# Patient Record
Sex: Female | Born: 1985 | Race: White | Hispanic: Yes | Marital: Married | Smoking: Never smoker
Health system: Southern US, Community
[De-identification: ages and names within clinical notes are randomized; demographics above are authoritative.]

## PROBLEM LIST (undated history)

## (undated) ENCOUNTER — Emergency Department (HOSPITAL_COMMUNITY): Payer: Self-pay

## (undated) DIAGNOSIS — E785 Hyperlipidemia, unspecified: Secondary | ICD-10-CM

## (undated) DIAGNOSIS — E119 Type 2 diabetes mellitus without complications: Secondary | ICD-10-CM

## (undated) DIAGNOSIS — I10 Essential (primary) hypertension: Secondary | ICD-10-CM

## (undated) HISTORY — DX: Essential (primary) hypertension: I10

## (undated) HISTORY — DX: Hyperlipidemia, unspecified: E78.5

## (undated) HISTORY — DX: Type 2 diabetes mellitus without complications: E11.9

## (undated) HISTORY — PX: NO PAST SURGERIES: SHX2092

---
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
## 2006-10-03 ENCOUNTER — Inpatient Hospital Stay (HOSPITAL_COMMUNITY): Admission: AD | Admit: 2006-10-03 | Discharge: 2006-10-05 | Payer: Self-pay | Admitting: Obstetrics

## 2007-01-01 ENCOUNTER — Ambulatory Visit: Payer: Self-pay | Admitting: Family Medicine

## 2007-02-03 ENCOUNTER — Encounter (INDEPENDENT_AMBULATORY_CARE_PROVIDER_SITE_OTHER): Payer: Self-pay | Admitting: *Deleted

## 2007-02-03 ENCOUNTER — Ambulatory Visit: Payer: Self-pay | Admitting: Family Medicine

## 2007-03-12 ENCOUNTER — Ambulatory Visit: Payer: Self-pay | Admitting: Obstetrics and Gynecology

## 2007-04-09 ENCOUNTER — Ambulatory Visit: Payer: Self-pay | Admitting: Obstetrics and Gynecology

## 2007-04-20 ENCOUNTER — Ambulatory Visit: Payer: Self-pay | Admitting: Family Medicine

## 2007-05-13 ENCOUNTER — Encounter (INDEPENDENT_AMBULATORY_CARE_PROVIDER_SITE_OTHER): Payer: Self-pay | Admitting: Family Medicine

## 2007-05-13 ENCOUNTER — Ambulatory Visit: Payer: Self-pay | Admitting: Family Medicine

## 2007-12-25 ENCOUNTER — Ambulatory Visit: Payer: Self-pay | Admitting: *Deleted

## 2007-12-25 ENCOUNTER — Ambulatory Visit: Payer: Self-pay | Admitting: Family Medicine

## 2008-04-14 ENCOUNTER — Ambulatory Visit: Payer: Self-pay | Admitting: Family Medicine

## 2008-05-30 ENCOUNTER — Ambulatory Visit: Payer: Self-pay | Admitting: Internal Medicine

## 2008-06-14 ENCOUNTER — Ambulatory Visit: Payer: Self-pay | Admitting: Internal Medicine

## 2008-06-16 ENCOUNTER — Ambulatory Visit (HOSPITAL_COMMUNITY): Admission: RE | Admit: 2008-06-16 | Discharge: 2008-06-16 | Payer: Self-pay | Admitting: Family Medicine

## 2009-12-12 ENCOUNTER — Emergency Department (HOSPITAL_COMMUNITY): Admission: EM | Admit: 2009-12-12 | Discharge: 2009-12-12 | Payer: Self-pay | Admitting: Family Medicine

## 2010-08-02 ENCOUNTER — Encounter (INDEPENDENT_AMBULATORY_CARE_PROVIDER_SITE_OTHER): Payer: Self-pay | Admitting: Family Medicine

## 2010-08-02 LAB — CONVERTED CEMR LAB
AST: 18 units/L (ref 0–37)
Albumin: 4.7 g/dL (ref 3.5–5.2)
BUN: 11 mg/dL (ref 6–23)
CO2: 24 meq/L (ref 19–32)
Calcium: 9.4 mg/dL (ref 8.4–10.5)
Chlamydia, Swab/Urine, PCR: NEGATIVE
Chloride: 104 meq/L (ref 96–112)
Creatinine, Ser: 0.49 mg/dL (ref 0.40–1.20)
Glucose, Bld: 84 mg/dL (ref 70–99)
Hemoglobin: 14.1 g/dL (ref 12.0–15.0)
Lymphocytes Relative: 40 % (ref 12–46)
Lymphs Abs: 2.2 10*3/uL (ref 0.7–4.0)
Monocytes Absolute: 0.3 10*3/uL (ref 0.1–1.0)
Monocytes Relative: 5 % (ref 3–12)
Neutro Abs: 2.7 10*3/uL (ref 1.7–7.7)
Neutrophils Relative %: 50 % (ref 43–77)
Potassium: 4 meq/L (ref 3.5–5.3)
RBC: 4.5 M/uL (ref 3.87–5.11)
TSH: 1.99 microintl units/mL (ref 0.350–4.500)
Vit D, 25-Hydroxy: 18 ng/mL — ABNORMAL LOW (ref 30–89)
WBC: 5.4 10*3/uL (ref 4.0–10.5)

## 2011-01-07 LAB — POCT URINALYSIS DIP (DEVICE)
Nitrite: NEGATIVE
Protein, ur: NEGATIVE mg/dL
Specific Gravity, Urine: 1.015 (ref 1.005–1.030)
Urobilinogen, UA: 1 mg/dL (ref 0.0–1.0)

## 2011-01-07 LAB — POCT PREGNANCY, URINE: Preg Test, Ur: NEGATIVE

## 2016-02-28 ENCOUNTER — Ambulatory Visit: Payer: Self-pay | Attending: Internal Medicine

## 2016-09-18 LAB — AMB REFERRAL TO OB-GYN: Pap: NEGATIVE

## 2017-02-03 ENCOUNTER — Encounter (HOSPITAL_COMMUNITY): Payer: Self-pay | Admitting: Emergency Medicine

## 2017-02-03 ENCOUNTER — Emergency Department (HOSPITAL_COMMUNITY)
Admission: EM | Admit: 2017-02-03 | Discharge: 2017-02-04 | Disposition: A | Discharge status: Home / Self Care | Payer: Self-pay | Attending: Emergency Medicine | Admitting: Emergency Medicine

## 2017-02-03 DIAGNOSIS — R739 Hyperglycemia, unspecified: Secondary | ICD-10-CM | POA: Insufficient documentation

## 2017-02-03 LAB — BASIC METABOLIC PANEL
ANION GAP: 13 (ref 5–15)
BUN: 8 mg/dL (ref 6–20)
CALCIUM: 9.7 mg/dL (ref 8.9–10.3)
CO2: 20 mmol/L — AB (ref 22–32)
Chloride: 97 mmol/L — ABNORMAL LOW (ref 101–111)
Creatinine, Ser: 0.52 mg/dL (ref 0.44–1.00)
Glucose, Bld: 492 mg/dL — ABNORMAL HIGH (ref 65–99)
Potassium: 3.4 mmol/L — ABNORMAL LOW (ref 3.5–5.1)
Sodium: 130 mmol/L — ABNORMAL LOW (ref 135–145)

## 2017-02-03 LAB — URINALYSIS, ROUTINE W REFLEX MICROSCOPIC
BILIRUBIN URINE: NEGATIVE
Bacteria, UA: NONE SEEN
Glucose, UA: >=500 mg/dL — AB
KETONES UR: NEGATIVE mg/dL
LEUKOCYTES UA: NEGATIVE
NITRITE: NEGATIVE
PROTEIN: NEGATIVE mg/dL
Specific Gravity, Urine: 1.009 (ref 1.005–1.030)
pH: 6 (ref 5.0–8.0)

## 2017-02-03 LAB — CBC
HCT: 41.2 % (ref 36.0–46.0)
HEMOGLOBIN: 14.7 g/dL (ref 12.0–15.0)
MCH: 32 pg (ref 26.0–34.0)
MCHC: 35.7 g/dL (ref 30.0–36.0)
MCV: 89.6 fL (ref 78.0–100.0)
Platelets: 230 10*3/uL (ref 150–400)
RBC: 4.6 MIL/uL (ref 3.87–5.11)
RDW: 11.8 % (ref 11.5–15.5)
WBC: 6.4 10*3/uL (ref 4.0–10.5)

## 2017-02-03 LAB — CBG MONITORING, ED: GLUCOSE-CAPILLARY: 495 mg/dL — AB (ref 65–99)

## 2017-02-03 NOTE — ED Triage Notes (Signed)
Patient arrives with complaint of hyperglycemia. Was at family's home this evening and was feeling worse than usual. States 3 months of dizziness, lightheadedness, blurred vision, fatigue, and thirst. Family member who is diabetic checked patient's blood sugar and found the level to be near 600. Family member then gave patient  of Metformin from his supply. Patient states that today her symptoms are much worse than usual.

## 2017-02-04 LAB — PREGNANCY, URINE: Preg Test, Ur: NEGATIVE

## 2017-02-04 LAB — CBG MONITORING, ED: GLUCOSE-CAPILLARY: 300 mg/dL — AB (ref 65–99)

## 2017-02-04 MED ORDER — METFORMIN HCL 500 MG PO TABS: 500.0000 mg | ORAL_TABLET | Freq: Two times a day (BID) | ORAL | 0 refills | Status: DC

## 2017-02-04 MED ORDER — SODIUM CHLORIDE 0.9 % IV BOLUS (SEPSIS)
1000.0000 mL | Freq: Once | INTRAVENOUS | Status: AC
  Administered 2017-02-04: 1000 mL via INTRAVENOUS

## 2017-02-04 NOTE — ED Provider Notes (Signed)
[facility]-EMERGENCY DEPT Provider Note   CSN: [id number] Arrival date & time: 02/03/17  2056  By signing my name below, I, Lindsay Hunt, attest that this documentation has been prepared under the direction and in the presence of [name], MD. Electronically Signed: Rosario Hunt, ED Scribe. 02/04/17. 12:53 AM.  History   Chief Complaint Chief Complaint  Patient presents with  . Hyperglycemia   The history is provided by the patient. A language interpreter was used [place], [id number]).  Hyperglycemia  Blood sugar level PTA:  Near 600 Onset quality:  Gradual Timing:  Constant Progression:  Worsening Chronicity:  New Diabetes status:  Non-diabetic Ineffective treatments: Metformin. Associated symptoms: abdominal pain, fatigue and increased thirst   Associated symptoms: no chest pain, no fever, no nausea and no vomiting   Abdominal pain:    Location:  Suprapubic Risk factors: family hx of diabetes    HPI Comments: Lindsay Hunt is a 31 y.o. female with no pertinent PMHx, who presents to the Emergency Department complaining of worsening hyperglycemia beginning today. Per pt, she arrives in the ED today to have her CBG checked. She notes that over the past two months that she has been dizzy, increasingly thirsty, and more fatigued from her baseline. She also mentions that she has experienced intermittent suprapubic abdominal pain, had an appearance of worsening facial acne, and has been steadily loosing weight as well. Pt also reports that today her symptoms seemed worse from her baseline. She does not have a h/o diagnosed DM; however, pt's brother who is a diabetic did check her CBG yesterday and it was noted to be near 600 at that time. He did give her  of his Metformin at that time without noted relief of her symptoms. Pt is not currently followed by a PCP. She denies fever, nausea, vomiting, chest pain, dysuria, or any other associated symptoms.   PMH - none OB  History    No data available     Home Medications    Prior to Admission medications   Not on File   Family History History reviewed. No pertinent family history.  Social History Social History  Substance Use Topics  . Smoking status: Never Smoker  . Smokeless tobacco: Never Used  . Alcohol use No   llergies   Patient has no known allergies.  Review of Systems Review of Systems  Constitutional: Positive for fatigue and unexpected weight change. Negative for fever.  Cardiovascular: Negative for chest pain.  Gastrointestinal: Positive for abdominal pain. Negative for nausea and vomiting.  Endocrine: Positive for polydipsia.  All other systems reviewed and are negative.  Physical Exam Updated Vital Signs BP 116/83 (BP Location: Right Arm)   Pulse 79   Temp 97.7 F (36.5 C) (Oral)   Resp 16   Ht 5' (1.524 m)   Wt 146 lb 14.4 oz (66.6 kg)   LMP 02/03/2017 (Exact Date) Comment: States 2 months of bleeding   SpO2 100%   BMI 28.69 kg/m   Physical Exam CONSTITUTIONAL: Well developed/well nourished HEAD: Normocephalic/atraumatic EYES: EOMI/PERRL ENMT: Mucous membranes moist NECK: supple no meningeal signs SPINE/BACK:entire spine nontender CV: S1/S2 noted, no murmurs/rubs/gallops noted LUNGS: Lungs are clear to auscultation bilaterally, no apparent distress ABDOMEN: soft, nontender, no rebound or guarding, bowel sounds noted throughout abdomen GU:no cva tenderness NEURO: Pt is awake/alert/appropriate, moves all extremitiesx4.  No facial droop.   EXTREMITIES: pulses normal/equal, full ROM SKIN: warm, color normal; facial acne noted PSYCH: no abnormalities of mood noted, alert and  oriented to situation  ED Treatments / Results  DIAGNOSTIC STUDIES: Oxygen Saturation is 100% on RA, normal by my interpretation.   COORDINATION OF CARE: 12:29 AM-Discussed next steps with pt. Pt verbalized understanding and is agreeable with the plan.   Labs (all labs ordered are  listed, but only abnormal results are displayed) Labs Reviewed  BASIC METABOLIC PANEL - Abnormal; Notable for the following:       Result Value   Sodium 130 (*)    Potassium 3.4 (*)    Chloride 97 (*)    CO2 20 (*)    Glucose, Bld 492 (*)    All other components within normal limits  URINALYSIS, ROUTINE W REFLEX MICROSCOPIC - Abnormal; Notable for the following:    Color, Urine COLORLESS (*)    Glucose, UA >=500 (*)    Hgb urine dipstick MODERATE (*)    Squamous Epithelial / LPF 0-5 (*)    All other components within normal limits  CBG MONITORING, ED - Abnormal; Notable for the following:    Glucose-Capillary 495 (*)    All other components within normal limits  CBG MONITORING, ED - Abnormal; Notable for the following:    Glucose-Capillary 300 (*)    All other components within normal limits  CBC  PREGNANCY, URINE   EKG  EKG Interpretation None      Radiology No results found.  Procedures Procedures   Medications Ordered in ED Medications  sodium chloride 0.9 % bolus 1,000 mL (0 mLs Intravenous Stopped 02/04/17 0150)  sodium chloride 0.9 % bolus 1,000 mL (0 mLs Intravenous Stopped 02/04/17 0150)    Initial Impression / Assessment and Plan / ED Course  I have reviewed the triage vital signs and the nursing notes.  Pertinent labs  results that were available during my care of the patient were reviewed by me and considered in my medical decision making (see chart for details).    2:50 AM  Pt stable No anion gap Glucose improved with IV fluids Will start metformin At time of discharge with interpreter - pt mentions recent irregular vag bleeding She is not pregnant Hemoglobin normal Advised f/u with GYN Referred to PCP as well for glucose control We discussed return precautions   Final Clinical Impressions(s) / ED Diagnoses   Final diagnoses:  Hyperglycemia   New Prescriptions Discharge Medication List as of 02/04/2017  2:40 AM    START taking these  medications   Details  metFORMIN (GLUCOPHAGE) 500 MG tablet Take 1 tablet (500 mg total) by mouth 2 (two) times daily with a meal., Starting Tue 02/04/2017, Print       I personally performed the services described in this documentation, which was scribed in my presence. The recorded information has been reviewed and is accurate.       [name], MD 02/04/17 [phone]

## 2017-02-04 NOTE — ED Notes (Signed)
Pt verbalized understanding of d/c instructions and has no further questions. Pt is stable, A&Ox4, VSS.  

## 2017-02-14 ENCOUNTER — Ambulatory Visit: Payer: Self-pay | Attending: Physician Assistant

## 2017-02-20 ENCOUNTER — Ambulatory Visit (INDEPENDENT_AMBULATORY_CARE_PROVIDER_SITE_OTHER): Payer: Self-pay | Admitting: Physician Assistant

## 2017-02-20 ENCOUNTER — Encounter (INDEPENDENT_AMBULATORY_CARE_PROVIDER_SITE_OTHER): Payer: Self-pay | Admitting: Physician Assistant

## 2017-02-20 VITALS — BP 109/72 | HR 74 | Temp 98.1°F | Ht 60.0 in | Wt 143.8 lb

## 2017-02-20 DIAGNOSIS — R739 Hyperglycemia, unspecified: Secondary | ICD-10-CM

## 2017-02-20 DIAGNOSIS — E1169 Type 2 diabetes mellitus with other specified complication: Secondary | ICD-10-CM

## 2017-02-20 LAB — POCT URINALYSIS DIPSTICK
BILIRUBIN UA: NEGATIVE
GLUCOSE UA: 100
Ketones, UA: NEGATIVE
LEUKOCYTES UA: NEGATIVE
NITRITE UA: NEGATIVE
Protein, UA: NEGATIVE
Spec Grav, UA: 1.015 (ref 1.010–1.025)
UROBILINOGEN UA: 0.2 U/dL
pH, UA: 7 (ref 5.0–8.0)

## 2017-02-20 LAB — POCT GLYCOSYLATED HEMOGLOBIN (HGB A1C): HEMOGLOBIN A1C: 11.3

## 2017-02-20 LAB — POCT CBG (FASTING - GLUCOSE)-MANUAL ENTRY: Glucose Fasting, POC: 231 mg/dL — AB (ref 70–99)

## 2017-02-20 MED ORDER — PEN NEEDLES 30G X 8 MM MISC: 1.0000 "application " | Freq: Every day | 0 refills | Status: DC

## 2017-02-20 MED ORDER — INSULIN GLARGINE 100 UNIT/ML SOLOSTAR PEN: 10.0000 [IU] | PEN_INJECTOR | Freq: Every day | SUBCUTANEOUS | 99 refills | Status: DC

## 2017-02-20 MED ORDER — BLOOD GLUCOSE MONITOR KIT: PACK | 0 refills | Status: DC

## 2017-02-20 NOTE — Progress Notes (Signed)
Subjective:  Patient ID: Lindsay Hunt, female    DOB: 08/15/1986  Age: 31 y.o. MRN: [id number]  CC: diabetes  HPI Lindsay Hunt is a 31 y.o. female with newly diagnosed DM2 presents to establish care for DM2. She learned that she had diabetes 2 weeks ago. Is only taking metformin. Currently with polyuria, tingling in the fingers, visual blurring, some confusion/memory loss, and fatigue. Denies polydipsia and abdominal pain. A1c 11.3% in clinic today. CBG 231 in clinic today. UA with glucose 100, neg ketone  Outpatient Medications Prior to Visit  Medication Sig Dispense Refill  . metFORMIN (GLUCOPHAGE) 500 MG tablet Take 1 tablet (500 mg total) by mouth 2 (two) times daily with a meal. 28 tablet 0   No facility-administered medications prior to visit.      ROS Review of Systems  Constitutional: Positive for malaise/fatigue. Negative for chills and fever.  Eyes: Positive for blurred vision.  Respiratory: Negative for shortness of breath.   Cardiovascular: Negative for chest pain and palpitations.  Gastrointestinal: Negative for abdominal pain and nausea.  Genitourinary: Negative for dysuria and hematuria.  Musculoskeletal: Negative for joint pain and myalgias.  Skin: Negative for rash.  Neurological: Positive for tingling. Negative for headaches.  Endo/Heme/Allergies: Negative for polydipsia.  Psychiatric/Behavioral: Positive for memory loss. Negative for depression. The patient is not nervous/anxious.     Objective:  BP 109/72 (BP Location: Left Arm, Patient Position: Sitting, Cuff Size: Normal)   Pulse 74   Temp 98.1 F (36.7 C) (Oral)   Ht 5' (1.524 m)   Wt 143 lb 12.8 oz (65.2 kg)   LMP 02/03/2017 (Exact Date) Comment: States 2 months of bleeding   SpO2 98%   BMI 28.08 kg/m   BP/Weight 02/20/2017 02/04/2017 4/97/0263  Systolic BP 785 885 -  Diastolic BP 72 67 -  Wt. (Lbs) 143.8 - 146.9  BMI 28.08 - 28.69      Physical Exam  Constitutional: She is oriented to  person, place, and time.  Well developed, overweight, NAD, polite  HENT:  Head: Normocephalic and atraumatic.  No oral thrush  Eyes: No scleral icterus.  Neck: Normal range of motion. Neck supple. No thyromegaly present.  Cardiovascular: Normal rate, regular rhythm and normal heart sounds.   Pulmonary/Chest: Effort normal and breath sounds normal.  Abdominal: Soft. Bowel sounds are normal. There is no tenderness.  Musculoskeletal: She exhibits no edema.  Neurological: She is alert and oriented to person, place, and time.  Skin: Skin is warm and dry. No rash noted. No erythema. No pallor.  Psychiatric: She has a normal mood and affect. Her behavior is normal. Thought content normal.  Vitals reviewed.    Assessment & Plan:   1. Type 2 diabetes mellitus with other specified complication, without long-term current use of insulin (HCC) - Urinalysis Dipstick with glucose 100, neg ketone - Begin  blood glucose meter kit and supplies KIT; Dispense based on patient and insurance preference. Use up to four times daily as directed. (FOR ICD-9 250.00, 250.01).  Dispense: 1 each; Refill: 0 - Begin Insulin Pen Needle (PEN NEEDLES) 30G X 8 MM MISC; 1 application by Does not apply route at bedtime.  Dispense: 30 each; Refill: 0 - Begin Insulin Glargine (LANTUS SOLOSTAR) 100 UNIT/ML Solostar Pen; Inject 10 Units into the skin daily at 10 pm.  Dispense: 1 pen; Refill: PRN - Comprehensive metabolic panel - Microalbumin/Creatinine Ratio, Urine  2. Hyperglycemia - HgB A1c 11.3% in clinic today - Glucose (CBG), Fasting 231  in clinic today.   Meds ordered this encounter  Medications  . DISCONTD: Insulin Glargine (LANTUS SOLOSTAR) 100 UNIT/ML Solostar Pen    Sig: Inject 10 Units into the skin daily at 10 pm.    Dispense:  1 pen    Refill:  PRN    Order Specific Question:   Supervising Provider    Answer:   Tresa Garter [[id number]]  . DISCONTD: Insulin Pen Needle (PEN NEEDLES) 30G X 8 MM MISC     Sig: 1 application by Does not apply route at bedtime.    Dispense:  30 each    Refill:  0    Order Specific Question:   Supervising Provider    Answer:   Tresa Garter [id number]  . blood glucose meter kit and supplies KIT    Sig: Dispense based on patient and insurance preference. Use up to four times daily as directed. (FOR ICD-9 250.00, 250.01).    Dispense:  1 each    Refill:  0    Order Specific Question:   Supervising Provider    Answer:   Tresa Garter [id number]    Order Specific Question:   Number of strips    Answer:   100    Order Specific Question:   Number of lancets    Answer:   100  . Insulin Pen Needle (PEN NEEDLES) 30G X 8 MM MISC    Sig: 1 application by Does not apply route at bedtime.    Dispense:  30 each    Refill:  0    Order Specific Question:   Supervising Provider    Answer:   Tresa Garter [id number]  . Insulin Glargine (LANTUS SOLOSTAR) 100 UNIT/ML Solostar Pen    Sig: Inject 10 Units into the skin daily at 10 pm.    Dispense:  1 pen    Refill:  PRN    Order Specific Question:   Supervising Provider    Answer:   Tresa Garter [[id number]]    Follow-up: Return in about 2 weeks (around 03/06/2017) for diabetes.   Clent Demark PA

## 2017-02-20 NOTE — Patient Instructions (Signed)
La diabetes mellitus y los alimentos (Diabetes Mellitus and Food) Es importante que controle su nivel de azcar en la sangre (glucosa). El nivel de glucosa en sangre depende en gran medida de lo que usted come. Comer alimentos saludables en las cantidades adecuadas a lo largo del da, aproximadamente a la misma hora todos los das, lo ayudar a controlar su nivel de glucosa en sangre. Tambin puede ayudarlo a retrasar o evitar el empeoramiento de la diabetes mellitus. Comer de manera saludable incluso puede ayudarlo a mejorar el nivel de presin arterial y a alcanzar o mantener un peso saludable. Entre las recomendaciones generales para alimentarse y cocinar los alimentos de forma saludable, se incluyen las siguientes:  Respetar las comidas principales y comer colaciones con regularidad. Evitar pasar largos perodos sin comer con el fin de perder peso.  Seguir una dieta que consista principalmente en alimentos de origen vegetal, como frutas, vegetales, frutos secos, legumbres y cereales integrales.  Utilizar mtodos de coccin a baja temperatura, como hornear, en lugar de mtodos de coccin a alta temperatura, como frer en abundante aceite. Trabaje con el nutricionista para aprender a usar la informacin nutricional de las etiquetas de los alimentos. CMO PUEDEN AFECTARME LOS ALIMENTOS? Carbohidratos Los carbohidratos afectan el nivel de glucosa en sangre ms que cualquier otro tipo de alimento. El nutricionista lo ayudar a determinar cuntos carbohidratos puede consumir en cada comida y ensearle a contarlos. El recuento de carbohidratos es importante para mantener la glucosa en sangre en un nivel saludable, en especial si utiliza insulina o toma determinados medicamentos para la diabetes mellitus. Alcohol El alcohol puede provocar disminuciones sbitas de la glucosa en sangre (hipoglucemia), en especial si utiliza insulina o toma determinados medicamentos para la diabetes mellitus. La  hipoglucemia es una afeccin que puede poner en peligro la vida. Los sntomas de la hipoglucemia (somnolencia, mareos y desorientacin) son similares a los sntomas de haber consumido mucho alcohol. Si el mdico lo autoriza a beber alcohol, hgalo con moderacin y siga estas pautas:  Las mujeres no deben beber ms de un trago por da, y los hombres no deben beber ms de dos tragos por da. Un trago es igual a:  12 onzas (355 ml) de cerveza  5 onzas de vino (150 ml) de vino  1,5onzas (45ml) de bebidas espirituosas  No beba con el estmago vaco.  Mantngase hidratado. Beba agua, gaseosas dietticas o t helado sin azcar.  Las gaseosas comunes, los jugos y otros refrescos podran contener muchos carbohidratos y se deben contar. QU ALIMENTOS NO SE RECOMIENDAN? Cuando haga las elecciones de alimentos, es importante que recuerde que todos los alimentos son distintos. Algunos tienen menos nutrientes que otros por porcin, aunque podran tener la misma cantidad de caloras o carbohidratos. Es difcil darle al cuerpo lo que necesita cuando consume alimentos con menos nutrientes. Estos son algunos ejemplos de alimentos que debera evitar ya que contienen muchas caloras y carbohidratos, pero pocos nutrientes:  Grasas trans (la mayora de los alimentos procesados incluyen grasas trans en la etiqueta de Informacin nutricional).  Gaseosas comunes.  Jugos.  Caramelos.  Dulces, como tortas, pasteles, rosquillas y galletas.  Comidas fritas. QU ALIMENTOS PUEDO COMER? Consuma alimentos ricos en nutrientes, que nutrirn el cuerpo y lo mantendrn saludable. Los alimentos que debe comer tambin dependern de varios factores, como:  Las caloras que necesita.  Los medicamentos que toma.  Su peso.  El nivel de glucosa en sangre.  El nivel de presin arterial.  El nivel de colesterol. Debe consumir   una amplia variedad de alimentos, por ejemplo:  Protenas.  Cortes de carne  magros.  Protenas con bajo contenido de grasas saturadas, como pescado, clara de huevo y frijoles. Evite las carnes procesadas.  Frutas y vegetales.  Frutas y vegetales que pueden ayudar a controlar los niveles sanguneos de glucosa, como manzanas, mangos y batatas.  Productos lcteos.  Elija productos lcteos sin grasa o con bajo contenido de grasa, como leche, yogur y queso.  Cereales, panes, pastas y arroz.  Elija cereales integrales, como panes multicereales, avena en grano y arroz integral. Estos alimentos pueden ayudar a controlar la presin arterial.  Grasas.  Alimentos que contengan grasas saludables, como frutos secos, aguacate, aceite de oliva, aceite de canola y pescado. TODOS LOS QUE PADECEN DIABETES MELLITUS TIENEN EL MISMO PLAN DE COMIDAS? Dado que todas las personas que padecen diabetes mellitus son distintas, no hay un solo plan de comidas que funcione para todos. Es muy importante que se rena con un nutricionista que lo ayudar a crear un plan de comidas adecuado para usted. Esta informacin no tiene como fin reemplazar el consejo del mdico. Asegrese de hacerle al mdico cualquier pregunta que tenga. Document Released: 01/07/2008 Document Revised: 10/21/2014 Document Reviewed: 08/27/2013 Elsevier Interactive Patient Education  2017 Elsevier Inc.  

## 2017-02-21 ENCOUNTER — Other Ambulatory Visit (INDEPENDENT_AMBULATORY_CARE_PROVIDER_SITE_OTHER): Payer: Self-pay | Admitting: Physician Assistant

## 2017-02-21 ENCOUNTER — Telehealth (INDEPENDENT_AMBULATORY_CARE_PROVIDER_SITE_OTHER): Payer: Self-pay | Admitting: Physician Assistant

## 2017-02-21 DIAGNOSIS — E119 Type 2 diabetes mellitus without complications: Secondary | ICD-10-CM

## 2017-02-21 LAB — COMPREHENSIVE METABOLIC PANEL
ALBUMIN: 4.4 g/dL (ref 3.5–5.5)
ALK PHOS: 104 IU/L (ref 39–117)
ALT: 38 IU/L — ABNORMAL HIGH (ref 0–32)
AST: 28 IU/L (ref 0–40)
Albumin/Globulin Ratio: 1.5 (ref 1.2–2.2)
BUN/Creatinine Ratio: 16 (ref 9–23)
BUN: 8 mg/dL (ref 6–20)
Bilirubin Total: 0.3 mg/dL (ref 0.0–1.2)
CALCIUM: 10 mg/dL (ref 8.7–10.2)
CO2: 23 mmol/L (ref 18–29)
CREATININE: 0.51 mg/dL — AB (ref 0.57–1.00)
Chloride: 102 mmol/L (ref 96–106)
GFR calc Af Amer: 146 mL/min/{1.73_m2} (ref >59)
GFR, EST NON AFRICAN AMERICAN: 127 mL/min/{1.73_m2} (ref >59)
GLOBULIN, TOTAL: 2.9 g/dL (ref 1.5–4.5)
GLUCOSE: 210 mg/dL — AB (ref 65–99)
Potassium: 4.3 mmol/L (ref 3.5–5.2)
SODIUM: 138 mmol/L (ref 134–144)
Total Protein: 7.3 g/dL (ref 6.0–8.5)

## 2017-02-21 LAB — MICROALBUMIN / CREATININE URINE RATIO
CREATININE, UR: 39.3 mg/dL
Microalb/Creat Ratio: <7.6 mg/g creat (ref 0.0–30.0)

## 2017-02-21 MED ORDER — METFORMIN HCL 500 MG PO TABS: 500.0000 mg | ORAL_TABLET | Freq: Two times a day (BID) | ORAL | 0 refills | Status: DC

## 2017-02-21 NOTE — Telephone Encounter (Signed)
Patient called left voicemail stated pharmacy [facility]  Please send RX and call her back when sent.

## 2017-02-21 NOTE — Progress Notes (Signed)
Refill request

## 2017-02-24 ENCOUNTER — Ambulatory Visit (INDEPENDENT_AMBULATORY_CARE_PROVIDER_SITE_OTHER): Payer: Self-pay | Admitting: Physician Assistant

## 2017-02-24 ENCOUNTER — Encounter (INDEPENDENT_AMBULATORY_CARE_PROVIDER_SITE_OTHER): Payer: Self-pay | Admitting: Physician Assistant

## 2017-02-24 VITALS — BP 109/72 | HR 75 | Temp 98.1°F | Wt 144.2 lb

## 2017-02-24 DIAGNOSIS — E119 Type 2 diabetes mellitus without complications: Secondary | ICD-10-CM

## 2017-02-24 MED ORDER — METFORMIN HCL 500 MG PO TABS: 500.0000 mg | ORAL_TABLET | Freq: Two times a day (BID) | ORAL | 1 refills | Status: DC

## 2017-02-24 NOTE — Telephone Encounter (Signed)
FWD to PCP. Tempestt S Roberts, CMA  

## 2017-02-24 NOTE — Telephone Encounter (Signed)
I spoke to patient in clinic this morning about her issue.

## 2017-02-24 NOTE — Patient Instructions (Signed)
Control del nivel sanguíneo de glucosa en los adultos °(Blood Glucose Monitoring, Adult) °El control del nivel de azúcar (glucosa) en la sangre lo ayuda a tener la diabetes bajo control. También ayuda a que usted y el médico controlen si el tratamiento de la diabetes es eficaz. El control del nivel sanguíneo de glucosa implica realizar controles regulares como lo indique el médico y llevar registro de los resultados (registro diario). °¿POR QUÉ DEBO CONTROLAR EL NIVEL SANGUÍNEO DE GLUCOSA? °Si controla su nivel sanguíneo de glucosa con regularidad, podrá: °· Comprender de qué manera los alimentos, la actividad física, las enfermedades y los medicamentos inciden en los niveles sanguíneos de glucosa. °· Conocer el nivel sanguíneo de glucosa en cualquier momento dado. Saber rápidamente si el nivel es bajo (hipoglucemia) o alto (hiperglucemia). °· Puede ser de ayuda para que usted y el médico sepan cómo ajustar los medicamentos. °¿CUÁNDO DEBO CONTROLAR EL NIVEL SANGUÍNEO DE GLUCOSA? °Siga las indicaciones del médico acerca de la frecuencia con la que debe controlar el nivel sanguíneo de glucosa. La frecuencia puede depender de: °· El tipo de diabetes que tenga. °· Si su diabetes está bajo control. °· Los medicamentos que toma. °Si usted tiene diabetes tipo 1: °· Controle su nivel sanguíneo de glucosa al menos dos veces al día. °· También controle su nivel sanguíneo de glucosa: °? Antes de cada inyección de insulina. °? Antes y después de hacer ejercicio. °? Entre las comidas. °? Dos horas después de una comida. °? Ocasionalmente, entre las 2:00 a. m. y las 3:00 a. m., como se lo hayan indicado. °? Antes de realizar tareas peligrosas, como manejar o usar maquinaria pesada. °? A la hora de acostarse. °· Es posible que deba controlar con más frecuencia los niveles sanguíneos de glucosa, hasta 6 a 10 veces por día: °? Si usa una bomba de insulina. °? Si necesita varias inyecciones diarias. °? Si su diabetes no está bien  controlada. °? Si está enfermo. °? Si tiene antecedentes de hipoglucemia grave. °? Si tiene antecedentes de no darse cuenta cuándo está bajando su nivel sanguíneo de glucosa (hipoglucemia asintomática). °Si usted tiene diabetes tipo 2: °· Si recibe insulina u otro medicamento para la diabetes, controle el nivel sanguíneo de glucosa al menos dos veces al día. °· Mientras reciba tratamiento intensivo con insulina, debe medirse el nivel sanguíneo de glucosa al menos 4 veces al día. Ocasionalmente, es posible que deba controlarse entre las 2:00 a. m. y las 3:00 a. m., según se lo indiquen. °· También controle su nivel sanguíneo de glucosa: °? Antes y después de hacer ejercicio. °? Antes de realizar tareas peligrosas, como manejar o usar maquinaria pesada. °· Es posible que deba controlar con más frecuencia los niveles sanguíneos de glucosa si: °? Es necesario ajustar la dosis de sus medicamentos. °? Su diabetes no está bien controlada. °? Está enfermo. °¿QUÉ ES UN REGISTRO DIARIO DEL NIVEL SANGUÍNEO DE GLUCOSA? °· Un registro diario es un registro de los valores de glucosa en la sangre. Puede ayudarles a usted y a su médico a: °? Buscar patrones en su nivel sanguíneo de glucosa durante el transcurso del tiempo. °? Ajustar su plan de control de la diabetes como sea necesario. °· Cada vez que controle su nivel sanguíneo de glucosa, anote el resultado y aquellos factores que pueden estar afectando su nivel sanguíneo de glucosa, como la dieta y la actividad física realizada en el día. °· La mayoría de los medidores de glucosa guardan un registro de las lecturas realizadas con el medidor. Algunos permiten descargar sus registros en   una computadora. ° °¿CÓMO ME CONTROLO EL NIVEL SANGUÍNEO DE GLUCOSA? °Siga los siguientes pasos para obtener lecturas precisas de su glucemia: °Materiales necesarios °· Medidor de glucosa en la sangre. °· Tiras reactivas para el medidor. Cada medidor tiene sus propias tiras reactivas. Debe usar  las tiras reactivas que trae su medidor. °· Una aguja para pincharse el dedo (lanceta). No utilice la misma lanceta en más de una ocasión. °· Un dispositivo que sujeta la lanceta (dispositivo de punción). °· Un diario o libro de anotaciones para anotar los resultados. °Procedimiento °· Lávese las manos con agua y jabón. °· Pínchese el costado del dedo (no la punta) con la lanceta. Use un dedo diferente cada vez. °· Frote suavemente el dedo hasta que aparezca una pequeña gota de sangre. °· Siga las instrucciones que vienen con el medidor para insertar la tira reactiva, aplicar la sangre sobre la tira y usar el medidor de glucosa en la sangre. °· Registre el resultado y las observaciones que desee. °Zonas del cuerpo alternativas para realizar las pruebas °· Algunos medidores le permiten tomar sangre para la prueba de otras zonas del cuerpo que no son el dedo (zonas alternativas). °· Si cree que tiene hipoglucemia o si tiene hipoglucemia asintomática, no utilice las zonas alternativas del cuerpo. En su lugar, use los dedos. °· Es posible que las zonas alternativas no sean tan precisas como los dedos porque el flujo de sangre es más lento en esas zonas. Esto significa que el resultado que obtiene de estas zonas puede estar retrasado y ser un poco diferente del resultado que obtendría del dedo. °· Los sitios alternativos más comunes son los siguientes: °? Los antebrazos. °? Los muslos. °? La palma de la mano. °Consejos adicionales °· Siempre tenga los insumos a mano. °· Todos los medidores de glucosa incluyen un número de teléfono "directo", disponible las 24 horas, al que podrá llamar si tiene preguntas o necesita ayuda. También puede consultar a su médico. °· Después de usar algunas cajas de tiras reactivas, ajuste (calibre) el medidor de glucemia según las instrucciones del medidor. °Esta información no tiene como fin reemplazar el consejo del médico. Asegúrese de hacerle al médico cualquier pregunta que  tenga. °Document Released: 09/30/2005 Document Revised: 01/22/2016 Document Reviewed: 03/11/2016 °Elsevier Interactive Patient Education © 2017 [employer]vier Inc. ° °

## 2017-02-24 NOTE — Progress Notes (Signed)
Subjective:  Patient ID: Lindsay Hunt, female    DOB: [birth year]  Age: 31 y.o. MRN: [id number]  CC: question regarding insulin  HPI Lindsay Hunt Bold is a 31 y.o. female with newly diagnosed DM2 returns today after a visit 4 days ago concerned that she may not be using her Lantus correctly. Says that the insulin in the Solostar pen does not seem to be used up. She is turning the dial to 10 and injecting as instructed. Says her sugars are better when checking with her new glucometer. Blood glucose low 117 and high 180. She also has another glucometer, aside from the one she was prescribed, that is relatively new and says there is a big discrepancy when using her own and the prescribed one. Measured back to back and found a 50 point difference between the machines. Lastly, she said [facility] did not have the Metformin prescription and would like a prescription for her Metformin.  Does not endorse any other symptoms.        Outpatient Medications Prior to Visit  Medication Sig Dispense Refill  . blood glucose meter kit and supplies KIT Dispense based on patient and insurance preference. Use up to four times daily as directed. (FOR ICD-9 250.00, 250.01). 1 each 0  . Insulin Glargine (LANTUS SOLOSTAR) 100 UNIT/ML Solostar Pen Inject 10 Units into the skin daily at 10 pm. 1 pen PRN  . Insulin Pen Needle (PEN NEEDLES) 30G X 8 MM MISC 1 application by Does not apply route at bedtime. 30 each 0  . metFORMIN (GLUCOPHAGE) 500 MG tablet Take 1 tablet (500 mg total) by mouth 2 (two) times daily with a meal. 28 tablet 0   No facility-administered medications prior to visit.      ROS Review of Systems  Constitutional: Negative for chills, fever and malaise/fatigue.  Eyes: Positive for blurred vision.  Respiratory: Negative for shortness of breath.   Cardiovascular: Negative for chest pain and palpitations.  Gastrointestinal: Negative for abdominal pain and nausea.  Genitourinary: Negative for dysuria and  hematuria.  Musculoskeletal: Negative for joint pain and myalgias.  Skin: Negative for rash.  Neurological: Negative for tingling and headaches.  Endo/Heme/Allergies: Negative for polydipsia.  Psychiatric/Behavioral: Negative for depression. The patient is not nervous/anxious.     Objective:  BP 109/72 (BP Location: Left Arm, Patient Position: Sitting, Cuff Size: Normal)   Pulse 75   Temp 98.1 F (36.7 C) (Oral)   Wt 144 lb 3.2 oz (65.4 kg)   LMP 02/03/2017 (Exact Date) Comment: States 2 months of bleeding   SpO2 98%   BMI 28.16 kg/m   BP/Weight 02/24/2017 02/20/2017 8/92/1194  Systolic BP 174 081 448  Diastolic BP 72 72 67  Wt. (Lbs) 144.2 143.8 -  BMI 28.16 28.08 -      Physical Exam  Constitutional: She is oriented to person, place, and time.  Well developed, obese, NAD, polite  HENT:  Head: Normocephalic and atraumatic.  Eyes: No scleral icterus.  Cardiovascular: Normal rate, regular rhythm and normal heart sounds.   Pulmonary/Chest: Effort normal and breath sounds normal.  Neurological: She is alert and oriented to person, place, and time.  Skin: Skin is warm and dry. No rash noted. No erythema. No pallor.  Psychiatric: She has a normal mood and affect. Her behavior is normal. Thought content normal.  Vitals reviewed.    Assessment & Plan:   1. Type 2 diabetes mellitus without complication, without long-term current use of insulin (HCC) - Refill metFORMIN (GLUCOPHAGE)  500 MG tablet; Take 1 tablet (500 mg total) by mouth 2 (two) times daily with a meal.  Dispense: 180 tablet; Refill: 1 - Continue Lantus as directed. Report blood sugars that are consistently falling in the 80s-70s.     Meds ordered this encounter  Medications  . metFORMIN (GLUCOPHAGE) 500 MG tablet    Sig: Take 1 tablet (500 mg total) by mouth 2 (two) times daily with a meal.    Dispense:  180 tablet    Refill:  1    Order Specific Question:   Supervising Provider    AnswerTresa Garter [[id number]]    Follow-up: Return for keep appointment for 03/12/17.   Clent Demark PA

## 2017-03-12 ENCOUNTER — Encounter (INDEPENDENT_AMBULATORY_CARE_PROVIDER_SITE_OTHER): Payer: Self-pay | Admitting: Physician Assistant

## 2017-03-12 ENCOUNTER — Ambulatory Visit (INDEPENDENT_AMBULATORY_CARE_PROVIDER_SITE_OTHER): Payer: Self-pay | Admitting: Physician Assistant

## 2017-03-12 VITALS — BP 115/81 | HR 74 | Temp 97.6°F | Resp 18 | Ht 62.0 in | Wt 155.0 lb

## 2017-03-12 DIAGNOSIS — Z23 Encounter for immunization: Secondary | ICD-10-CM

## 2017-03-12 DIAGNOSIS — E119 Type 2 diabetes mellitus without complications: Secondary | ICD-10-CM

## 2017-03-12 DIAGNOSIS — B353 Tinea pedis: Secondary | ICD-10-CM

## 2017-03-12 LAB — GLUCOSE, POCT (MANUAL RESULT ENTRY): POC GLUCOSE: 91 mg/dL (ref 70–99)

## 2017-03-12 MED ORDER — CLOTRIMAZOLE 1 % EX CREA: 1.0000 "application " | TOPICAL_CREAM | Freq: Two times a day (BID) | CUTANEOUS | 0 refills | Status: DC

## 2017-03-12 MED ORDER — GLIMEPIRIDE 2 MG PO TABS: 2.0000 mg | ORAL_TABLET | Freq: Every day | ORAL | 3 refills | Status: DC

## 2017-03-12 MED ORDER — PNEUMOCOCCAL 13-VAL CONJ VACC IM SUSP: 0.5000 mL | INTRAMUSCULAR | 0 refills | Status: AC

## 2017-03-12 MED ORDER — METFORMIN HCL ER 500 MG PO TB24: 500.0000 mg | ORAL_TABLET | Freq: Every day | ORAL | 1 refills | Status: DC

## 2017-03-12 NOTE — Patient Instructions (Signed)
Metformin extended-release tablets Qu es este medicamento? La METFORMINA se [place] para tratar la diabetes tipo 2. Ayuda a Advice worker de Dispensing optician. El tratamiento se [place] con ejercicios y [place]. Este [employer] se puede usar solo o con otros medicamentos para la diabetes. Este medicamento puede ser utilizado para otros usos; si tiene alguna pregunta consulte con su proveedor de atencin mdica o con su farmacutico. MARCAS COMUNES: Fortamet, Glucophage XR, Glumetza Qu le debo informar a mi profesional de la salud antes de tomar este medicamento? Necesita saber si usted presenta alguno de los siguientes problemas o situaciones: -anemia -si consume bebidas alcohlicas con frecuencia -se deshidrata con facilidad -ataque cardiaco -insuficiencia cardiaca -enfermedad renal -enfermedad heptica -sndrome de ovarios poliqusticos -infeccin o lesin severa -vmitos -una reaccin alrgica o inusual a la metformina, a otros medicamentos, alimentos, colorantes o conservantes -si est embarazada o buscando quedar embarazada -si est amamantando a un beb Cmo debo utilizar este medicamento? Tome este medicamento por va oral con un vaso de agua. Tmelo con las comidas. Ingiralo entero, no lo triture ni mastique. Siga las instrucciones de la etiqueta del [place]. Tome sus dosis a intervalos regulares. No tome su medicamento con una frecuencia mayor a la indicada. Hable con su pediatra para informarse acerca del uso de este medicamento en nios. Puede requerir atencin especial. Sobredosis: Pngase en contacto inmediatamente con un centro toxicolgico o una sala de urgencia si usted cree que haya tomado demasiado medicamento. ATENCIN: [employer] es solo para usted. No comparta este medicamento con nadie. Qu sucede si me olvido de una dosis? Si olvida una dosis, tmela lo antes posible. Si es casi la hora de la prxima dosis, tome slo esa dosis. No tome dosis  adicionales o dobles. Qu puede interactuar con este medicamento? No tome esta medicina con ninguno de los siguientes medicamentos: -dofetilida -gatifloxacino -ciertos agentes de contraste administrados antes de un procedimiento con rayos X, tomografas computadas (CT), MRI u otros procedimientos Esta medicina tambin puede interactuar con los siguientes medicamentos: -acetazolamida -ciertos medicamentos para infeccin por VIH o hepatitis, tales como adefovir, emtricitabina, entecavir, lamivudina o tenofovir -cimetidina -crizotinib -digoxina -diurticos -hormonas femeninas, como estrgenos o progestinas y pldoras anticonceptivas -glucopirrolato -isoniazida -lamotrigina -medicamentos para presin sangunea, enfermedad cardiaca, pulso cardiaco irregular -memantina -midodrina -metazolamida -morfina -cido nicotnico -fenotiazinas, tales como clorpromacina, mesoridazina, proclorperazina, tioridazina -fenitona -procainamida -propantelina -quinidina -quinina -ranitidina -ranolazina -medicamentos esteroideos, como prednisona o cortisona -medicamentos estimulantes para trastornos de Freight forwarder, perder peso o mantenerse despierto -medicamentos tiroideos -topiramato -trimetoprima -trospio -vancomicina -vandetanib -zonisamida Puede ser que esta lista no menciona todas las posibles interacciones. Informe a su profesional de [employer]	[place] de [employer] productos a base de hierbas, medicamentos de [place] o suplementos nutritivos que est tomando. Si usted fuma, consume bebidas alcohlicas o si utiliza drogas ilegales, indqueselo tambin a su profesional de [employer]	[place]. Algunas sustancias pueden interactuar con su medicamento. A qu debo estar atento al usar [employer]? Visite a su mdico o a su profesional de la salud para chequear su evolucin peridicamente. Un examen llamado HbA1C (A1C) ser monitoreado. Es un simple examen de [place]. Mide su control de azcar en la sangre  durante los ltimos 2 a 3 meses. Usted recibir [employer] cada 3 a 6 meses. Aprenda cmo controlar el nivel de azcar en la sangre. Aprenda a reconocer los sntomas de bajo y alto nivel de azcar en la sangre y cmo tratarlos. Siempre lleve consigo una fuente rpida de azcar por si acaso experimenta  sntomas de bajo nivel de Dispensing optician. Ejemplos incluyen caramelos duros o tabletas de glucosa. Asegrese de que los miembros de su familia sepan que se puede ahogar si come o bebe mientras tiene sntomas graves de bajo nivel de azcar en la sangre, tales como convulsiones o prdida del conocimiento. Deben obtener ayuda mdica inmediatamente. Informe a su mdico o a su profesional de la salud si tiene alto nivel de Dispensing optician. Tal vez sea necesario cambiar la dosis de su medicamento. Si est enfermo o haciendo mucho ms ejercicio que el habitual, puede ser necesario cambiar la dosis de su medicamento. No se salte comidas. Pregunte a su mdico o a su profesional de la salud si debe evitar el consumo de alcohol. Muchos productos de venta libre para tos y resfros contienen azcar y alcohol. Estos pueden Magazine features editor de azcar en la sangre. Este medicamento puede provocar la ovulacin en mujeres premenopusicas que no tienen periodos menstruales regulares. Esto puede aumentar la posibilidad de [place]. No debe tomar este medicamento si se queda embarazada o si cree que est embarazada. Consulte a su mdico o su profesional de la salud sobre sus opciones anticonceptivas mientras est tomando [employer]. Si cree que est embarazada, consulte a su mdico o su profesional de la salud inmediatamente. El recubrimiento de la tableta de algunas marcas de este medicamento no se disuelve. Esto es normal. Es posible que encuentra el recubrimiento intacto de las tabletas en las heces. Esto no debe ser motivo de preocupacin. Si va a someterse a una operacin, IRM (MRI), tomografa  computarizada u otro procedimiento, informe a su mdico que est tomando [employer]. Usted podr necesitar dejar de tomar este medicamento antes del procedimiento. Use una pulsera o cadena de identificacin mdica. Lleve consigo una tarjeta de identificacin con informacin sobre su enfermedad y Scientist, research (medical) de sus medicamentos y los horarios de las dosis. Qu efectos secundarios puedo tener al [employer] este medicamento? Efectos secundarios que debe informar a su mdico o a Barrister's clerk de la salud tan pronto como sea posible: -Chief of Staff como erupcin cutnea, picazn o urticarias, hinchazn de la cara, labios o lengua -problemas respiratorios -sensacin de desmayos o aturdimiento, cadas -dolores o molestias musculares -signos o sntomas de bajo nivel de azcar en la sangre tales como sentirse ansioso, confusin, mareos, aumento de apetito, debilidad o cansancio inusual, sudoracin, temblores, fro, irritabilidad, dolor de cabeza, visin borrosa, pulso cardaco rpido, prdida del conocimiento -pulso cardiaco irregular o lento -molestias o dolor de estmago inusual -cansancio o debilidad inusual Efectos secundarios que, por lo general, no requieren atencin mdica (debe informarlos a su mdico o a su profesional de la salud si persisten o si son molestos): -diarrea -dolor de cabeza [place] de estmago -sabor metlico en la boca -nuseas -molestias estomacales, gases Puede ser que esta lista no menciona todos los posibles efectos secundarios. Comunquese a su mdico por asesoramiento mdico [employer]. Usted puede informar los efectos secundarios a la FDA por telfono al 1-800-FDA-1088. Dnde debo guardar mi medicina? Mantngala fuera del alcance de los nios. [place] a [employer], entre 15 y 73 grados C (39 y 27 grados F). Protjala de la luz. Deseche todo el medicamento que no haya utilizado, despus de la fecha de vencimiento. ATENCIN: Este  folleto es un resumen. Puede ser que no cubra toda la posible informacin. Si usted tiene preguntas acerca de esta medicina, consulte con su mdico, su farmacutico o su profesional de Technical sales engineer.  2018 Elsevier/Gold Standard (2014-11-22 00:00:00)

## 2017-03-12 NOTE — Progress Notes (Signed)
Patient is here for DM FU  Patient denies pain at this time.  Patient has not taken medication today. Patient has not eaten today. 

## 2017-03-12 NOTE — Progress Notes (Signed)
Subjective:  Patient ID: Lindsay Hunt, female    DOB: 1986-10-04  Age: 31 y.o. MRN: [id number]  CC: f/u diabetes  HPI Lindsay Hunt is a 31 y.o. female with a PMH of DM2 presents on f/u of DM2. Has been taking glucometer readings as directed. Glucometer low 86, high 123. Complaints are of mild nausea and lightheadedness once or twice per week, also of mild LE tingling and numbness. Numbness improved with walking. Denies polydipsia, polyuria, visual blurring, or fatigue. Does not endorse any other symptoms.    Outpatient Medications Prior to Visit  Medication Sig Dispense Refill  . blood glucose meter kit and supplies KIT Dispense based on patient and insurance preference. Use up to four times daily as directed. (FOR ICD-9 250.00, 250.01). 1 each 0  . Insulin Glargine (LANTUS SOLOSTAR) 100 UNIT/ML Solostar Pen Inject 10 Units into the skin daily at 10 pm. 1 pen PRN  . Insulin Pen Needle (PEN NEEDLES) 30G X 8 MM MISC 1 application by Does not apply route at bedtime. 30 each 0  . metFORMIN (GLUCOPHAGE) 500 MG tablet Take 1 tablet (500 mg total) by mouth 2 (two) times daily with a meal. 180 tablet 1   No facility-administered medications prior to visit.      ROS Review of Systems  Constitutional: Negative for chills, fever and malaise/fatigue.  Eyes: Negative for blurred vision.  Respiratory: Negative for shortness of breath.   Cardiovascular: Negative for chest pain and palpitations.  Gastrointestinal: Positive for nausea. Negative for abdominal pain.  Genitourinary: Negative for dysuria and hematuria.  Musculoskeletal: Negative for joint pain and myalgias.  Skin: Negative for rash.  Neurological: Positive for tingling. Negative for headaches.       Lightheadedness  Psychiatric/Behavioral: Negative for depression. The patient is not nervous/anxious.     Objective:  BP 115/81 (BP Location: Left Arm, Patient Position: Sitting, Cuff Size: Normal)   Pulse 74   Temp 97.6 F (36.4 C) (Oral)    Resp 18   Ht _0  (1.575 m)   Wt 155 lb (70.3 kg)   LMP 01/26/2017   SpO2 100%   BMI 28.35 kg/m   BP/Weight 03/12/2017 02/24/2017 3/79/0240  Systolic BP 973 532 992  Diastolic BP 81 72 72  Wt. (Lbs) 155 144.2 143.8  BMI 28.35 28.16 28.08      Physical Exam  Constitutional: She is oriented to person, place, and time.  Well developed, well nourished, NAD, polite  HENT:  Head: Normocephalic and atraumatic.  Eyes: No scleral icterus.  Cardiovascular: Normal rate, regular rhythm and normal heart sounds.   Pulmonary/Chest: Effort normal and breath sounds normal.  Musculoskeletal: She exhibits no edema.  Neurological: She is alert and oriented to person, place, and time. No cranial nerve deficit. Coordination normal.  Skin: Skin is warm and dry. No rash noted. No erythema. No pallor.  Psychiatric: She has a normal mood and affect. Her behavior is normal. Thought content normal.  Vitals reviewed.    Assessment & Plan:   1. Type 2 diabetes mellitus without complication, unspecified whether long term insulin use (HCC) - Glucose (CBG) 91 in clinic today. - Begin metFORMIN (GLUCOPHAGE-XR) 500 MG 24 hr tablet; Take 1 tablet (500 mg total) by mouth daily with breakfast.  Dispense: 90 tablet; Refill: 1 - Begin glimepiride (AMARYL) 2 MG tablet; Take 1 tablet (2 mg total) by mouth daily before breakfast.  Dispense: 30 tablet; Refill: 3 - Stop Lantus and Metformin IR - Ambulatory referral to Ophthalmology -  Diabetic foot exam conducted and documented today.   2. Need for Tdap vaccination - Tdap vaccine greater than or equal to 7yo IM  3. Need for prophylactic vaccination against Streptococcus pneumoniae (pneumococcus) - Pneumococcal conjugate vaccine 13-valent  4. Tinea pedis of both feet - Begin clotrimazole (LOTRIMIN) 1 % cream; Apply 1 application topically 2 (two) times daily.  Dispense: 30 g; Refill: 0   Meds ordered this encounter  Medications  . metFORMIN  (GLUCOPHAGE-XR) 500 MG 24 hr tablet    Sig: Take 1 tablet (500 mg total) by mouth daily with breakfast.    Dispense:  90 tablet    Refill:  1    Order Specific Question:   Supervising Provider    Answer:   Tresa Garter [id number]  . glimepiride (AMARYL) 2 MG tablet    Sig: Take 1 tablet (2 mg total) by mouth daily before breakfast.    Dispense:  30 tablet    Refill:  3    Order Specific Question:   Supervising Provider    Answer:   Tresa Garter [id number]  . clotrimazole (LOTRIMIN) 1 % cream    Sig: Apply 1 application topically 2 (two) times daily.    Dispense:  30 g    Refill:  0    Order Specific Question:   Supervising Provider    Answer:   Tresa Garter [[id number]]    Follow-up: Return in about 3 months (around 06/12/2017) for DM2 and A1c.   Clent Demark PA

## 2017-03-13 ENCOUNTER — Ambulatory Visit: Payer: Self-pay | Attending: Internal Medicine | Admitting: *Deleted

## 2017-03-13 DIAGNOSIS — Z23 Encounter for immunization: Secondary | ICD-10-CM | POA: Insufficient documentation

## 2017-03-13 NOTE — Progress Notes (Signed)
Pt here for Prevnar -13

## 2017-06-11 ENCOUNTER — Encounter (INDEPENDENT_AMBULATORY_CARE_PROVIDER_SITE_OTHER): Payer: Self-pay | Admitting: Physician Assistant

## 2017-06-11 ENCOUNTER — Ambulatory Visit (INDEPENDENT_AMBULATORY_CARE_PROVIDER_SITE_OTHER): Payer: Self-pay | Admitting: Physician Assistant

## 2017-06-11 VITALS — BP 103/76 | HR 74 | Temp 98.2°F | Wt 150.4 lb

## 2017-06-11 DIAGNOSIS — J069 Acute upper respiratory infection, unspecified: Secondary | ICD-10-CM

## 2017-06-11 DIAGNOSIS — E119 Type 2 diabetes mellitus without complications: Secondary | ICD-10-CM

## 2017-06-11 LAB — POCT GLYCOSYLATED HEMOGLOBIN (HGB A1C): HEMOGLOBIN A1C: 5.6

## 2017-06-11 MED ORDER — METFORMIN HCL ER 500 MG PO TB24: 500.0000 mg | ORAL_TABLET | Freq: Every day | ORAL | 1 refills | Status: DC

## 2017-06-11 MED ORDER — NAPROXEN 500 MG PO TABS: 500.0000 mg | ORAL_TABLET | Freq: Two times a day (BID) | ORAL | 0 refills | Status: DC

## 2017-06-11 MED ORDER — PHENYLEPHRINE-DM-GG-APAP 5-10-200-325 MG/10ML PO LIQD: 20.0000 mL | ORAL | 0 refills | Status: AC

## 2017-06-11 MED ORDER — GLIMEPIRIDE 2 MG PO TABS: 2.0000 mg | ORAL_TABLET | Freq: Every day | ORAL | 3 refills | Status: DC

## 2017-06-11 NOTE — Progress Notes (Signed)
Subjective:  Patient ID: Lindsay Hunt, female    DOB: 1986-04-03  Age: 31 y.o. MRN: [id number]  CC: f/u  HPI   Lindsay Hunt is a 31 y.o. female with a PMH of DM2 presents on f/u of DM2. Last A1c 11.3% on 02/20/17. A1c today is 5.6% . Has been taking glimepiride 2 mg and Metformin 500 mg XR as directed. Feels generally well. Does not exercise or diet. Reports some mild fatigue but better than before. Endorses tingling and numbness in the fingers on occasion. Denies polydipsia, polyuria, polyphagia, visual blurring. Does not endorse any other symptoms or complaints.     Patient also complains of sore throat, cough, sneezing, arthralgias, and possible fever x2 days. Husband was sick with sore throat, arthralgias, headache, and malaise. Has taken amoxicillin from the "[place] store" without relief. Does not endorse any other symptoms.   Outpatient Medications Prior to Visit  Medication Sig Dispense Refill  . blood glucose meter kit and supplies KIT Dispense based on patient and insurance preference. Use up to four times daily as directed. (FOR ICD-9 250.00, 250.01). 1 each 0  . clotrimazole (LOTRIMIN) 1 % cream Apply 1 application topically 2 (two) times daily. 30 g 0  . glimepiride (AMARYL) 2 MG tablet Take 1 tablet (2 mg total) by mouth daily before breakfast. 30 tablet 3  . metFORMIN (GLUCOPHAGE-XR) 500 MG 24 hr tablet Take 1 tablet (500 mg total) by mouth daily with breakfast. 90 tablet 1   No facility-administered medications prior to visit.      ROS Review of Systems  Constitutional: Positive for fever and malaise/fatigue. Negative for chills.  HENT: Positive for congestion and sore throat.   Eyes: Negative for blurred vision.  Respiratory: Negative for shortness of breath.   Cardiovascular: Negative for chest pain and palpitations.  Gastrointestinal: Negative for abdominal pain and nausea.  Genitourinary: Negative for dysuria and hematuria.  Musculoskeletal: Negative for joint pain  and myalgias.  Skin: Negative for rash.  Neurological: Negative for tingling and headaches.  Psychiatric/Behavioral: Negative for depression. The patient is not nervous/anxious.     Objective:  Wt 150 lb 6.4 oz (68.2 kg)   BMI 27.51 kg/m   BP/Weight 06/11/2017 03/12/2017 9/37/9024  Systolic BP - 097 353  Diastolic BP - 81 72  Wt. (Lbs) 150.4 155 144.2  BMI 27.51 28.35 28.16      Physical Exam  Constitutional: She is oriented to person, place, and time.  Well developed, well nourished, NAD, polite, reserved, appears mildly ill  HENT:  Head: Normocephalic and atraumatic.  Turbinates hypertrophic. Oropharynx erythematous without exudative lesions  Eyes: Conjunctivae are normal. No scleral icterus.  Neck: Normal range of motion. Neck supple. No thyromegaly present.  Cardiovascular: Normal rate, regular rhythm and normal heart sounds.   Pulmonary/Chest: Effort normal and breath sounds normal.  Musculoskeletal: She exhibits no edema.  Lymphadenopathy:    She has no cervical adenopathy.  Neurological: She is alert and oriented to person, place, and time. No cranial nerve deficit. Coordination normal.  Skin: Skin is warm and dry. No rash noted. No erythema. No pallor.  Psychiatric: She has a normal mood and affect. Her behavior is normal. Thought content normal.  Vitals reviewed.    Assessment & Plan:    1. Type 2 diabetes mellitus without complication, without long-term current use of insulin (HCC) - HgB A1c 5.6% in clinic today - metFORMIN (GLUCOPHAGE-XR) 500 MG 24 hr tablet; Take 1 tablet (500 mg total) by mouth daily with  breakfast.  Dispense: 90 tablet; Refill: 1 - glimepiride (AMARYL) 2 MG tablet; Take 1 tablet (2 mg total) by mouth daily before breakfast.  Dispense: 30 tablet; Refill: 3  2. Acute upper respiratory infection - naproxen (NAPROSYN) 500 MG tablet; Take 1 tablet (500 mg total) by mouth 2 (two) times daily with a meal.  Dispense: 30 tablet; Refill: 0 -  Phenylephrine-DM-GG-APAP (MUCINEX FAST-MAX COLD FLU) 5-10-200-325 MG/10ML LIQD; Take 20 mLs by mouth every 4 (four) hours.  Dispense: 1 Bottle; Refill: 0 - Advised to call at 7-10 days if no better.  Meds ordered this encounter  Medications  . metFORMIN (GLUCOPHAGE-XR) 500 MG 24 hr tablet    Sig: Take 1 tablet (500 mg total) by mouth daily with breakfast.    Dispense:  90 tablet    Refill:  1    Order Specific Question:   Supervising Provider    Answer:   Tresa Garter [id number]  . glimepiride (AMARYL) 2 MG tablet    Sig: Take 1 tablet (2 mg total) by mouth daily before breakfast.    Dispense:  30 tablet    Refill:  3    Order Specific Question:   Supervising Provider    Answer:   Tresa Garter [id number]  . naproxen (NAPROSYN) 500 MG tablet    Sig: Take 1 tablet (500 mg total) by mouth 2 (two) times daily with a meal.    Dispense:  30 tablet    Refill:  0    Order Specific Question:   Supervising Provider    Answer:   Tresa Garter [id number]  . Phenylephrine-DM-GG-APAP (MUCINEX FAST-MAX COLD FLU) 5-10-200-325 MG/10ML LIQD    Sig: Take 20 mLs by mouth every 4 (four) hours.    Dispense:  1 Bottle    Refill:  0    Order Specific Question:   Supervising Provider    Answer:   Tresa Garter [[id number]]    Follow-up: Return in about 3 months (around 09/11/2017) for full physical.   Lindsay Demark PA

## 2017-06-11 NOTE — Patient Instructions (Signed)
Infeccin del tracto respiratorio superior, adultos (Upper Respiratory Infection, Adult) La mayora de las infecciones del tracto respiratorio superior son infecciones virales de las vas que llevan el aire a los pulmones. Un infeccin del tracto respiratorio superior afecta la nariz, la garganta y las vas respiratorias superiores. El tipo ms frecuente de infeccin del tracto respiratorio superior es la nasofaringitis, que habitualmente se conoce como "resfro comn". Las infecciones del tracto respiratorio superior siguen su curso y por lo general se curan solas. En la mayora de los casos, la infeccin del tracto respiratorio superior no requiere atencin mdica, pero a veces, despus de una infeccin viral, puede surgir una infeccin bacteriana en las vas respiratorias superiores. Esto se conoce como infeccin secundaria. Las infecciones sinusales y en el odo medio son tipos frecuentes de infecciones secundarias en el tracto respiratorio superior. La neumona bacteriana tambin puede complicar un cuadro de infeccin del tracto respiratorio superior. Este tipo de infeccin puede empeorar el asma y la enfermedad pulmonar obstructiva crnica (EPOC). En algunos casos, estas complicaciones pueden requerir atencin mdica de emergencia y poner en peligro la vida. CAUSAS Casi todas las infecciones del tracto respiratorio superior se deben a los virus. Un virus es un tipo de microbio que puede contagiarse de una persona a otra. FACTORES DE RIESGO Puede estar en riesgo de sufrir una infeccin del tracto respiratorio superior si:  Fuma.  Tiene una enfermedad pulmonar o cardaca crnica.  Tiene debilitado el sistema de defensa (inmunitario) del cuerpo.  Es muy joven o de edad muy avanzada.  Tiene asma o alergias nasales.  Trabaja en reas donde hay mucha gente o poca ventilacin.  Trabaja en una escuela o en un centro de atencin mdica. SIGNOS Y SNTOMAS Habitualmente, los sntomas aparecen de  2a 3das despus de entrar en contacto con el virus del resfro. La mayora de las infecciones virales en el tracto respiratorio superior duran de 7a 10das. Sin embargo, las infecciones virales en el tracto respiratorio superior a causa del virus de la gripe pueden durar de 14a 18das y, habitualmente, son ms graves. Entre los sntomas se pueden incluir los siguientes:  Secrecin o congestin nasal.  Estornudos.  Tos.  Dolor de garganta.  Dolor de cabeza.  Fatiga.  Fiebre.  Prdida del apetito.  Dolor en la frente, detrs de los ojos y por encima de los pmulos (dolor sinusal).  Dolores musculares. DIAGNSTICO El mdico puede diagnosticar una infeccin del tracto respiratorio superior mediante los siguientes estudios:  Examen fsico.  Pruebas para verificar si los sntomas no se deben a otra afeccin, por ejemplo:  Faringitis estreptoccica.  Sinusitis.  Neumona.  Asma. TRATAMIENTO Esta infeccin desaparece sola, con el tiempo. No puede curarse con medicamentos, pero a menudo se prescriben para aliviar los sntomas. Los medicamentos pueden ser tiles para lo siguiente:  Bajar la fiebre.  Reducir la tos.  Aliviar la congestin nasal. INSTRUCCIONES PARA EL CUIDADO EN EL HOGAR  Tome los medicamentos solamente como se lo haya indicado el mdico.  A fin de aliviar el dolor de garganta, haga grgaras con solucin salina templada o consuma caramelos para la tos, como se lo haya indicado el mdico.  Use un humidificador de vapor clido o inhale el vapor de la ducha para aumentar la humedad del aire. Esto facilitar la respiracin.  Beba suficiente lquido para mantener la orina clara o de color amarillo plido.  Consuma sopas y otros caldos transparentes, y alimntese bien.  Descanse todo lo que sea necesario.  Regrese al trabajo cuando   la temperatura se le haya normalizado o cuando el mdico lo autorice. Es posible que deba quedarse en su casa durante un  tiempo prolongado, para no infectar a los dems. Tambin puede usar un barbijo y lavarse las manos con cuidado para evitar la propagacin del virus.  Aumente el uso del inhalador si tiene asma.  No consuma ningn producto que contenga tabaco, lo que incluye cigarrillos, tabaco de mascar o cigarrillos electrnicos. Si necesita ayuda para dejar de fumar, consulte al mdico. PREVENCIN La mejor manera de protegerse de un resfro es mantener una higiene adecuada.  Evite el contacto oral o fsico con personas que tengan sntomas de resfro.  En caso de contacto, lvese las manos con frecuencia. No hay pruebas claras de que la vitaminaC, la vitaminaE, la equincea o el ejercicio reduzcan la probabilidad de contraer un resfro. Sin embargo, siempre se recomienda descansar mucho, hacer ejercicio y alimentarse bien. SOLICITE ATENCIN MDICA SI:  Su estado empeora en lugar de mejorar.  Los medicamentos no logran controlar los sntomas.  Tiene escalofros.  La sensacin de falta de aire empeora.  Tiene mucosidad marrn o roja.  Tiene secrecin nasal amarilla o marrn.  Le duele la cara, especialmente al inclinarse hacia adelante.  Tiene fiebre.  Tiene los ganglios del cuello hinchados.  Siente dolor al tragar.  Tiene zonas blancas en la parte de atrs de la garganta. SOLICITE ATENCIN MDICA DE INMEDIATO SI:  Tiene sntomas intensos o persistentes de:  Dolor de cabeza.  Dolor de odos.  Dolor sinusal.  Dolor en el pecho.  Tiene enfermedad pulmonar crnica y cualquiera de estos sntomas:  Sibilancias.  Tos prolongada.  Tos con sangre.  Cambio en la mucosidad habitual.  Presenta rigidez en el cuello.  Tiene cambios en:  La visin.  La audicin.  El pensamiento.  El estado de nimo. ASEGRESE DE QUE:  Comprende estas instrucciones.  Controlar su afeccin.  Recibir ayuda de inmediato si no mejora o si empeora. Esta informacin no tiene como fin  reemplazar el consejo del mdico. Asegrese de hacerle al mdico cualquier pregunta que tenga. Document Released: 07/10/2005 Document Revised: 02/14/2015 Document Reviewed: 01/05/2014 Elsevier Interactive Patient Education  2017 Elsevier Inc.  

## 2017-10-24 ENCOUNTER — Ambulatory Visit: Payer: Self-pay | Attending: Physician Assistant

## 2017-12-01 ENCOUNTER — Ambulatory Visit (INDEPENDENT_AMBULATORY_CARE_PROVIDER_SITE_OTHER): Payer: Self-pay | Admitting: Physician Assistant

## 2017-12-01 ENCOUNTER — Encounter (INDEPENDENT_AMBULATORY_CARE_PROVIDER_SITE_OTHER): Payer: Self-pay | Admitting: Physician Assistant

## 2017-12-01 VITALS — BP 100/66 | HR 78 | Temp 98.3°F | Resp 18 | Ht 59.84 in | Wt 163.0 lb

## 2017-12-01 DIAGNOSIS — Z131 Encounter for screening for diabetes mellitus: Secondary | ICD-10-CM

## 2017-12-01 DIAGNOSIS — F411 Generalized anxiety disorder: Secondary | ICD-10-CM

## 2017-12-01 DIAGNOSIS — E119 Type 2 diabetes mellitus without complications: Secondary | ICD-10-CM

## 2017-12-01 DIAGNOSIS — R5383 Other fatigue: Secondary | ICD-10-CM

## 2017-12-01 LAB — POCT GLYCOSYLATED HEMOGLOBIN (HGB A1C): Hemoglobin A1C: 6.1

## 2017-12-01 MED ORDER — VITAMIN D-3 125 MCG (5000 UT) PO TABS: 1.0000 | ORAL_TABLET | Freq: Every day | ORAL | 0 refills | Status: DC

## 2017-12-01 MED ORDER — GLIMEPIRIDE 2 MG PO TABS: 2.0000 mg | ORAL_TABLET | Freq: Every day | ORAL | 3 refills | Status: DC

## 2017-12-01 MED ORDER — METFORMIN HCL ER 500 MG PO TB24: 500.0000 mg | ORAL_TABLET | Freq: Every day | ORAL | 1 refills | Status: DC

## 2017-12-01 NOTE — Progress Notes (Signed)
Subjective:  Patient ID: Lindsay Hunt, female    DOB: Nov 12, 1985  Age: 32 y.o. MRN: [id number]  CC: DM   HPI Lindsay Hunt a 32 y.o.femalewith a PMH of DM2 presents on f/u of DM2. A1c 5.6% nearly six months ago. A1c 6.1% in clinic today. Taking Metformin XR 500 mg one tablet qday and glimepiride 2 mg qday as directed. Pt has not dieted or exercised since she was last seen here. Reports dietary indiscretion over the holidays. Patient feels anxious about taking her medication and thinks perhaps she is going to have to take medicine for life or that maybe the medicine is damaging her body. Does not report any other symptoms or complaints.    Outpatient Medications Prior to Visit  Medication Sig Dispense Refill  . glimepiride (AMARYL) 2 MG tablet Take 1 tablet (2 mg total) by mouth daily before breakfast. 30 tablet 3  . metFORMIN (GLUCOPHAGE-XR) 500 MG 24 hr tablet Take 1 tablet (500 mg total) by mouth daily with breakfast. 90 tablet 1  . blood glucose meter kit and supplies KIT Dispense based on patient and insurance preference. Use up to four times daily as directed. (FOR ICD-9 250.00, 250.01). (Patient not taking: Reported on 12/01/2017) 1 each 0  . clotrimazole (LOTRIMIN) 1 % cream Apply 1 application topically 2 (two) times daily. (Patient not taking: Reported on 06/11/2017) 30 g 0  . naproxen (NAPROSYN) 500 MG tablet Take 1 tablet (500 mg total) by mouth 2 (two) times daily with a meal. (Patient not taking: Reported on 12/01/2017) 30 tablet 0   No facility-administered medications prior to visit.      ROS Review of Systems  Constitutional: Negative for chills, fever and malaise/fatigue.  Eyes: Negative for blurred vision.  Respiratory: Negative for shortness of breath.   Cardiovascular: Negative for chest pain and palpitations.  Gastrointestinal: Negative for abdominal pain and nausea.  Genitourinary: Negative for dysuria and hematuria.  Musculoskeletal: Negative for joint pain and  myalgias.  Skin: Negative for rash.  Neurological: Negative for tingling and headaches.  Psychiatric/Behavioral: Negative for depression. The patient is nervous/anxious.     Objective:  BP 100/66 (BP Location: Left Arm, Patient Position: Sitting, Cuff Size: Large)   Pulse 78   Temp 98.3 F (36.8 C) (Oral)   Resp 18   Ht 4' 11.84" (1.52 m)   Wt 163 lb (73.9 kg)   SpO2 98%   BMI 32.00 kg/m   BP/Weight 12/01/2017 06/11/2017 1/60/7371  Systolic BP 062 694 854  Diastolic BP 66 76 81  Wt. (Lbs) 163 150.4 155  BMI 32 27.51 28.35      Physical Exam  Constitutional: She is oriented to person, place, and time.  Well developed, overweight, NAD, polite  HENT:  Head: Normocephalic and atraumatic.  Eyes: No scleral icterus.  Neck: Normal range of motion. Neck supple. No thyromegaly present.  Cardiovascular: Normal rate and regular rhythm.  Pulmonary/Chest: Effort normal.  Musculoskeletal: She exhibits no edema.  Neurological: She is alert and oriented to person, place, and time.  Skin: Skin is warm and dry. No rash noted. No erythema. No pallor.  Psychiatric: Her behavior is normal. Thought content normal.  Somewhat guarded, somewhat anxious  Vitals reviewed.    Assessment & Plan:    1. Type 2 diabetes mellitus without complication, without long-term current use of insulin (HCC) - HgB A1c 6.1% today. Up from 5.6% six months ago. - Brief dietary counseling provided to patient. - Refill metFORMIN (GLUCOPHAGE-XR) 500 MG  24 hr tablet; Take 1 tablet (500 mg total) by mouth daily with breakfast.  Dispense: 90 tablet; Refill: 1 - Refill glimepiride (AMARYL) 2 MG tablet; Take 1 tablet (2 mg total) by mouth daily before breakfast.  Dispense: 30 tablet; Refill: 3 - Basic Metabolic Panel - Lipid panel  2. Fatigue, unspecified type - Begin Cholecalciferol (VITAMIN D-3) 5000 units TABS; Take 1 tablet by mouth daily.  Dispense: 30 tablet; Refill: 0 - Basic Metabolic Panel  3. Anxiety  state - TSH   Meds ordered this encounter  Medications  . metFORMIN (GLUCOPHAGE-XR) 500 MG 24 hr tablet    Sig: Take 1 tablet (500 mg total) by mouth daily with breakfast.    Dispense:  90 tablet    Refill:  1    Order Specific Question:   Supervising Provider    Answer:   Tresa Garter [id number]  . glimepiride (AMARYL) 2 MG tablet    Sig: Take 1 tablet (2 mg total) by mouth daily before breakfast.    Dispense:  30 tablet    Refill:  3    Order Specific Question:   Supervising Provider    Answer:   Tresa Garter [id number]  . Cholecalciferol (VITAMIN D-3) 5000 units TABS    Sig: Take 1 tablet by mouth daily.    Dispense:  30 tablet    Refill:  0    Order Specific Question:   Supervising Provider    Answer:   Tresa Garter [id number]    Follow-up: Return in about 3 months (around 02/28/2018) for DM.   Clent Demark PA

## 2017-12-01 NOTE — Patient Instructions (Signed)
Diabetes mellitus y nutrición  Diabetes Mellitus and Nutrition  Si sufre de diabetes (diabetes mellitus), es muy importante tener hábitos alimenticios saludables debido a que sus niveles de azúcar en la sangre (glucosa) se ven afectados en gran medida por lo que come y bebe. Comer alimentos saludables en las cantidades adecuadas, aproximadamente a la misma hora todos los días, lo ayudará a:  · Controlar la glucemia.  · Disminuir el riesgo de sufrir una enfermedad cardíaca.  · Mejorar la presión arterial.  · Alcanzar o mantener un peso saludable.    Todas las personas que sufren de diabetes son diferentes y cada una tiene necesidades diferentes en cuanto a un plan de alimentación. El médico puede recomendarle que trabaje con un especialista en dietas y nutrición (nutricionista) para elaborar el mejor plan para usted. Su plan de alimentación puede variar según factores como:  · Las calorías que necesita.  · Los medicamentos que toma.  · Su peso.  · Sus niveles de glucemia, presión arterial y colesterol.  · Su nivel de actividad.  · Otras afecciones que tenga, como enfermedades cardíacas o renales.    ¿Cómo me afectan los carbohidratos?  Los carbohidratos afectan el nivel de glucemia más que cualquier otro tipo de alimento. La ingesta de carbohidratos naturalmente aumenta la cantidad glucosa en la sangre. El recuento de carbohidratos es un método destinado a llevar un registro de la cantidad de carbohidratos que se ingieren. El recuento de carbohidratos es importante para mantener la glucemia a un nivel saludable, en especial si utiliza insulina o toma determinados medicamentos por vía oral para la diabetes.  Es importante saber la cantidad de carbohidratos que se pueden ingerir en cada comida sin correr ningún riesgo. Esto es diferente en cada persona. El nutricionista puede ayudarlo a calcular la cantidad de carbohidratos que debe ingerir en cada comida y colación.   Los alimentos que contienen carbohidratos incluyen:  · Pan, cereal, arroz, pasta y galletas.  · Papas y maíz.  · Guisantes, frijoles y lentejas.  · Leche y yogur.  · Frutas y jugo.  · Postres, como pasteles, galletitas, helado y caramelos.    ¿Cómo me afecta el alcohol?  El alcohol puede provocar disminuciones súbitas de la glucemia (hipoglucemia), en especial si utiliza insulina o toma determinados medicamentos por vía oral para la diabetes. La hipoglucemia es una afección potencialmente mortal. Los síntomas de la hipoglucemia (somnolencia, mareos y confusión) son similares a los síntomas de haber consumido demasiado alcohol.  Si el médico afirma que el alcohol es seguro para usted, siga estas pautas:  · Limite el consumo de alcohol a no más de 1 medida por día si es mujer y no está embarazada, y a 2 medidas si es hombre. Una medida equivale a 12 oz (355 ml) de cerveza, 5 oz (148 ml) de vino o 1½ oz (44 ml) de bebidas de alta graduación alcohólica.  · No beba con el estómago vacío.  · Manténgase hidratado con agua, gaseosas dietéticas o té helado sin azúcar.  · Tenga en cuenta que las gaseosas comunes, los jugos y otros refrescos pueden contener mucha azúcar y se deben contar como carbohidratos.    Consejos para seguir este plan  Leer las etiquetas de los alimentos  · Comience por controlar el tamaño de la porción en la etiqueta. La cantidad de calorías, carbohidratos, grasas y otros nutrientes mencionados en la etiqueta se basan en una porción del alimento. Muchos alimentos contienen más de una porción por envase.  · Verifique la cantidad total de gramos (g)   de carbohidratos totales en una porción. Puede calcular la cantidad de porciones de carbohidratos al dividir el total de carbohidratos por 15. Por ejemplo, si un alimento posee un total de 30 g de carbohidratos, equivale a 2 porciones de carbohidratos.  · Verifique la cantidad de gramos (g) de grasas saturadas y grasas trans  en una porción. Escoja alimentos que no contengan grasa o que tengan un bajo contenido.  · Controle la cantidad de miligramos (mg) de sodio en una porción. La mayoría de las personas deben limitar la ingesta de sodio total a menos de 2300 mg por día.  · Siempre consulte la información nutricional de los alimentos etiquetados como “con bajo contenido de grasa” o “sin grasa”. Estos alimentos pueden ser más altos en azúcar agregada o en carbohidratos refinados y deben evitarse.  · Hable con el nutricionista para identificar sus objetivos diarios en cuanto a los nutrientes mencionados en la etiqueta.  De compras  · Evite comprar alimentos procesados, enlatados o prehechos. Estos alimentos tienden a tener mayor cantidad de grasa, sodio y azúcar agregada.  · Compre en la zona exterior de la tienda de comestibles. Esta incluye frutas y vegetales frescos, granos a granel, carnes frescas y productos lácteos frescos.  Cocción  · Utilice métodos de cocción a baja temperatura, como hornear, en lugar de métodos de cocción a alta temperatura, como freír en abundante aceite.  · Cocine con aceites saludables, como el aceite de oliva, canola o girasol.  · Evite cocinar con manteca, crema o carnes con alto contenido de grasa.  Planificación de las comidas  · Consuma las comidas y las colaciones de forma regular, preferentemente a la misma hora todos los días. Evite pasar largos períodos de tiempo sin comer.  · Consuma alimentos ricos en fibra, como frutas frescas, verduras, frijoles y cereales integrales. Consulte al nutricionista sobre cuántas porciones de carbohidratos puede consumir en cada comida.  · Consuma entre 4 y 6 onzas de proteínas magras por día, como carnes magras, pollo, pescado, huevos o tofu. 1 onza equivale a 1 onza de carne, pollo o pescado, 1 huevo, o 1/4 taza de tofu.  · Coma algunos alimentos por día que contengan grasas saludables, como aguacates, frutos secos, semillas y pescado.  Estilo de vida     · Controle su nivel de glucemia con regularidad.  · Haga ejercicio al menos 30 minutos, 5 días o más por semana, o como se lo haya indicado el médico.  · Tome los medicamentos como se lo haya indicado el médico.  · No consuma ningún producto que contenga nicotina o tabaco, como cigarrillos y cigarrillos electrónicos. Si necesita ayuda para dejar de fumar, consulte al médico.  · Trabaje con un asesor o instructor en diabetes para identificar estrategias para controlar el estrés y cualquier desafío emocional y social.  ¿Cuáles son algunas de las preguntas que puedo hacerle a mi médico?  · ¿Es necesario que me reúna con un instructor en diabetes?  · ¿Es necesario que me reúna con un nutricionista?  · ¿A qué número puedo llamar si tengo preguntas?  · ¿Cuáles son los mejores momentos para controlar la glucemia?  Dónde encontrar más información:  · Asociación Americana de la Diabetes (American Diabetes Association): diabetes.org/food-and-fitness/food  · Academia de Nutrición y Dietética (Academy of Nutrition and Dietetics): www.eatright.org/resources/health/diseases-and-conditions/diabetes  · Instituto Nacional de la Diabetes y las Enfermedades Digestivas y Renales (National Institute of Diabetes and Digestive and Kidney Diseases) (Institutos Nacionales de Salud, NIH): www.niddk.nih.gov/health-information/diabetes/overview/diet-eating-physical-activity  Resumen  · Un plan de alimentación saludable   lo ayudará a controlar la glucemia y mantener un estilo de vida saludable.  · Trabajar con un especialista en dietas y nutrición (nutricionista) puede ayudarlo a elaborar el mejor plan de alimentación para usted.  · Tenga en cuenta que los carbohidratos y el alcohol tienen efectos inmediatos en sus niveles de glucemia. Es importante contar los carbohidratos y consumir alcohol con prudencia.  Esta información no tiene como fin reemplazar el consejo del médico. Asegúrese de hacerle al médico cualquier pregunta que tenga.   Document Released: 01/07/2008 Document Revised: 01/20/2017 Document Reviewed: 01/20/2017  Elsevier Interactive Patient Education © 2018 Elsevier Inc.

## 2017-12-02 ENCOUNTER — Other Ambulatory Visit (INDEPENDENT_AMBULATORY_CARE_PROVIDER_SITE_OTHER): Payer: Self-pay | Admitting: Physician Assistant

## 2017-12-02 DIAGNOSIS — E7841 Elevated Lipoprotein(a): Secondary | ICD-10-CM

## 2017-12-02 LAB — LIPID PANEL
CHOL/HDL RATIO: 5.4 ratio — AB (ref 0.0–4.4)
Cholesterol, Total: 189 mg/dL (ref 100–199)
HDL: 35 mg/dL — AB (ref >39)
LDL Calculated: 107 mg/dL — ABNORMAL HIGH (ref 0–99)
Triglycerides: 234 mg/dL — ABNORMAL HIGH (ref 0–149)
VLDL Cholesterol Cal: 47 mg/dL — ABNORMAL HIGH (ref 5–40)

## 2017-12-02 LAB — BASIC METABOLIC PANEL
BUN / CREAT RATIO: 13 (ref 9–23)
BUN: 8 mg/dL (ref 6–20)
CALCIUM: 9.5 mg/dL (ref 8.7–10.2)
CO2: 21 mmol/L (ref 20–29)
Chloride: 107 mmol/L — ABNORMAL HIGH (ref 96–106)
Creatinine, Ser: 0.62 mg/dL (ref 0.57–1.00)
GFR, EST AFRICAN AMERICAN: 137 mL/min/{1.73_m2} (ref >59)
GFR, EST NON AFRICAN AMERICAN: 119 mL/min/{1.73_m2} (ref >59)
Glucose: 114 mg/dL — ABNORMAL HIGH (ref 65–99)
POTASSIUM: 4.7 mmol/L (ref 3.5–5.2)
Sodium: 144 mmol/L (ref 134–144)

## 2017-12-02 LAB — TSH: TSH: 2.92 u[IU]/mL (ref 0.450–4.500)

## 2017-12-02 MED ORDER — LOVASTATIN 40 MG PO TABS: 40.0000 mg | ORAL_TABLET | Freq: Every day | ORAL | 3 refills | Status: DC

## 2017-12-08 ENCOUNTER — Telehealth (INDEPENDENT_AMBULATORY_CARE_PROVIDER_SITE_OTHER): Payer: Self-pay | Admitting: Physician Assistant

## 2017-12-08 NOTE — Telephone Encounter (Signed)
Spanish Patient wants to know about her Lab Results . Please, call her Thank you

## 2017-12-12 NOTE — Telephone Encounter (Signed)
-----   Message from Loletta Specteroger David Gomez, PA-C sent at 12/02/2017  1:53 PM EST ----- Thyroid normal, kidneys normal, cholesterol elevated. I have [place] [facility] [place]. This medicine should cost $10 for a three month supply. She also has three more refills added to the prescription.

## 2017-12-12 NOTE — Telephone Encounter (Signed)
Medical Assistant used [employer] to contact patient.  Interpreter Name: Leotis ShamesLauren Interpreter #: [phone] Please inform patient of thyroid and kidneys being normal. Patient should pick up lovastatin at [facility] for elevated cholesterol. Patient should receive a three month supply for 10$ No further questions at this time.

## 2018-03-02 ENCOUNTER — Encounter (INDEPENDENT_AMBULATORY_CARE_PROVIDER_SITE_OTHER): Payer: Self-pay | Admitting: Nurse Practitioner

## 2018-03-02 ENCOUNTER — Ambulatory Visit (INDEPENDENT_AMBULATORY_CARE_PROVIDER_SITE_OTHER): Payer: Self-pay | Admitting: Nurse Practitioner

## 2018-03-02 ENCOUNTER — Ambulatory Visit (INDEPENDENT_AMBULATORY_CARE_PROVIDER_SITE_OTHER): Payer: Self-pay | Admitting: Physician Assistant

## 2018-03-02 ENCOUNTER — Ambulatory Visit: Payer: Self-pay | Attending: Physician Assistant

## 2018-03-02 VITALS — BP 125/83 | HR 82 | Temp 97.3°F | Ht 62.0 in | Wt 164.6 lb

## 2018-03-02 DIAGNOSIS — E119 Type 2 diabetes mellitus without complications: Secondary | ICD-10-CM

## 2018-03-02 DIAGNOSIS — Z0000 Encounter for general adult medical examination without abnormal findings: Secondary | ICD-10-CM

## 2018-03-02 DIAGNOSIS — E782 Mixed hyperlipidemia: Secondary | ICD-10-CM

## 2018-03-02 LAB — POCT GLYCOSYLATED HEMOGLOBIN (HGB A1C): Hemoglobin A1C: 5.6

## 2018-03-02 MED ORDER — METFORMIN HCL ER 500 MG PO TB24: 500.0000 mg | ORAL_TABLET | Freq: Every day | ORAL | 1 refills | Status: DC

## 2018-03-02 MED ORDER — GLIMEPIRIDE 2 MG PO TABS: 2.0000 mg | ORAL_TABLET | Freq: Every day | ORAL | 1 refills | Status: DC

## 2018-03-02 NOTE — Patient Instructions (Signed)
Fascitis plantar  (Plantar Fasciitis)  La fascitis plantar es una afeccin dolorosa que se produce en el taln. Ocurre cuando la banda de tejido que conecta los dedos con el hueso del taln (fascia plantar) se irrita. Esto puede ocurrir despus de hacer mucho ejercicio u otras actividades repetitivas (lesin por uso excesivo). El dolor de la fascitis plantar puede ser de leve (irritacin) a intenso, y en los casos ms agudos puede dificultar que la persona camine o se mueva. Por lo general, el dolor es peor a la maana o despus de permanecer sentado o acostado durante un perodo.  CAUSAS  Este trastorno puede ser causado por:   Estar de pie durante largos perodos.   Usar zapatos que no calcen bien.   Practicar actividades de alto impacto, como correr, o hacer ejercicios aerbicos o ballet.   Tener sobrepeso.   Tener una forma de caminar (marcha) anormal.   Tener los msculos de la pantorrilla tensos.   Tener el arco alto en los pies.   Comenzar una nueva actividad fsica.  SNTOMAS  El sntoma principal de esta afeccin es el dolor en el taln. Otros sntomas pueden ser los siguientes:   Dolor que empeora despus de una actividad o un ejercicio.   Dolor ms intenso a la maana o despus de descansar.   Dolor que desaparece despus de caminar durante unos minutos.  DIAGNSTICO  Esta afeccin se puede diagnosticar en funcin de los signos y los sntomas. El mdico tambin le realizar un examen fsico para controlar si tiene lo siguiente:   Un rea dolorida en la parte inferior del pie.   El arco alto.   Dolor al mover el pie.   Dificultad para mover el pie.  Tambin puede necesitar estudios por imgenes para confirmar el diagnstico. Estos pueden incluir los siguientes:   Radiografas.   Ecografa.   Resonancia magntica.  TRATAMIENTO  El tratamiento de la fascitis plantar depende de la gravedad de la afeccin. El tratamiento puede incluir lo siguiente:   Reposo, hielo y analgsicos de venta  libre para controlar el dolor.   Ejercicios para estirar las pantorrillas y la fascia plantar.   Una frula que mantiene el pie estirado y hacia arriba mientras usted duerme (frula nocturna).   Fisioterapia para aliviar los sntomas y evitar problemas en el futuro.   Inyecciones de cortisona para aliviar el dolor intenso.   Tratamiento con ondas de choque extracorpreas para estimular con impulsos elctricos la fascia plantar lesionada. Esto suele usarse como un ltimo recurso antes de la ciruga.   Ciruga, si los otros tratamientos no han funcionado despus de 12meses.  INSTRUCCIONES PARA EL CUIDADO EN EL HOGAR   Tome los medicamentos solamente como se lo haya indicado el mdico.   Evite las actividades que causan dolor.   Frote la parte inferior del pie sobre una bolsa de hielo o una botella de agua fra. Haga esto durante 20minutos, de 3a 4veces al da.   Realice estiramientos simples como se lo haya indicado el mdico.   Trate de usar calzado deportivo con amortiguacin de aire o gel, o plantillas blandas.   Si el mdico se lo ha indicado, use una frula nocturna para dormir.   Cumpla con todas las visitas de control, segn le indique su mdico.  PREVENCIN   No realice ejercicios ni actividades que le causen dolor en el taln.   Considere la posibilidad de empezar actividades de bajo impacto si sigue teniendo problemas.   Pierda peso si   lo necesita.  La mejor forma de prevenir la fascitis plantar es evitar las actividades que lesionan ms la fascia plantar.  SOLICITE ATENCIN MDICA SI:   Los sntomas no desaparecen despus del tratamiento en su casa.   El dolor empeora.   El dolor afecta la capacidad de moverse o de realizar las actividades diarias.  Esta informacin no tiene como fin reemplazar el consejo del mdico. Asegrese de hacerle al mdico cualquier pregunta que tenga.  Document Released: 07/10/2005 Document Revised: 01/22/2016 Document Reviewed: 08/10/2014  Elsevier  Interactive Patient Education  2018 Elsevier Inc.

## 2018-03-02 NOTE — Progress Notes (Signed)
Assessment & Plan:  Lindsay Hunt was seen today for follow-up.  Diagnoses and all orders for this visit:  Type 2 diabetes mellitus without complication, without long-term current use of insulin (HCC) -     HgB A1c -     glimepiride (AMARYL) 2 MG tablet; Take 1 tablet (2 mg total) by mouth daily before breakfast. -     metFORMIN (GLUCOPHAGE-XR) 500 MG 24 hr tablet; Take 1 tablet (500 mg total) by mouth daily with breakfast. -     Microalbumin/Creatinine Ratio, Urine Continue blood sugar control as discussed in office today, low carbohydrate diet, and regular physical exercise as tolerated, 150 minutes per week (30 min each day, 5 days per week, or 50 min 3 days per week). Keep blood sugar logs with fasting goal of 80-130 mg/dl, post prandial less than 180.  For Hypoglycemia: BS <60 and Hyperglycemia BS >400; contact the clinic ASAP. Annual eye exams and foot exams are recommended.   Hyperlipidemia INSTRUCTIONS: Work on a low fat, heart healthy diet and participate in regular aerobic exercise program by working out at least 150 minutes per week. No fried foods. No junk foods, sodas, sugary drinks, unhealthy snacking, alcohol or smoking.    Routine adult health maintenance -     VITAMIN D 25 Hydroxy (Vit-D Deficiency, Fractures)    Patient has been counseled on age-appropriate routine health concerns for screening and prevention. These are reviewed and up-to-date. Referrals have been placed accordingly. Immunizations are up-to-date or declined.    Subjective:   Chief Complaint  Patient presents with  . Follow-up    DM   HPI Lindsay Hunt 32 y.o. female presents to office today for follow up to diabetes mellitus and hyperlipidemia. VRI was used to communicate directly with patient for the entire encounter including providing detailed patient instructions.    Diabetes Mellitus Type 2 Chronic. Highest A1c 11.3 02-20-2017. Disease course has been improving. There are no hypoglycemic symptoms.  There are no hypoglycemic complications. Symptoms are stable. There are nodiabetic complications. Risk factors for coronary artery disease include family history, dyslipidemia, diabetes mellitus, obesity, hypertension, sedentary lifestyle and stress. Current diabetic treatment includes metformin '500mg'$  daily and glimepiride '2mg'$  daily. Patient is compliant with treatment all of the time and monitors blood glucose at home 2 times per day. Fasting 90-100s; postprandial 140s.  Weight is not  Stable and increasing.  She was instructed today that she needs to move more (exercise). She states she is doing cardio and not losing weight: I have instructed her to walk at least 2 milies per day and increase the intensity. Patient follows a generally healthy diet. Meal planning includes avoidance of concentrated sweets and breads. Patient has not seen a dietician. Patient is compliant with exercise. An ACE inhibitor/angiotensin II receptor blocker is not being taken. She does not see a podiatrist. Eye exam is not current. Microalbumin ordered today.  Lab Results  Component Value Date   HGBA1C 5.6 03/02/2018   Lab Results  Component Value Date   HGBA1C 11.3 02/20/2017   Hyperlipidemia Patient presents for follow up to hyperlipidemia.  She is not medication compliant. States the pills "were hurting me". When I inquired as to what that means she endorses pain around the kidney area and increased urination. She states the symptoms went away after she stopped taking lovastatin.   She is not diet compliant and denies chest pain, dyspnea, exertional chest pressure/discomfort, poor exercise tolerance and skin xanthelasma or statin intolerance including myalgias. She was  instructed that she can take take omega 3 (2 capsules) for now and have her lipids rechecked at her next office visit. She is aware if her lipids are still elevated she will need to start on another statin. Her LDL is not currently at goal.   Lab Results   Component Value Date   CHOL 189 12/01/2017   Lab Results  Component Value Date   HDL 35 (L) 12/01/2017   Lab Results  Component Value Date   LDLCALC 107 (H) 12/01/2017   Lab Results  Component Value Date   TRIG 234 (H) 12/01/2017   Lab Results  Component Value Date   CHOLHDL 5.4 (H) 12/01/2017    Review of Systems  Constitutional: Negative for fever, malaise/fatigue and weight loss.  HENT: Negative.  Negative for nosebleeds.   Eyes: Negative.  Negative for blurred vision, double vision and photophobia.  Respiratory: Negative.  Negative for cough and shortness of breath.   Cardiovascular: Negative.  Negative for chest pain, palpitations and leg swelling.  Gastrointestinal: Negative.  Negative for heartburn, nausea and vomiting.  Musculoskeletal: Positive for joint pain (right foot pain with walking.). Negative for myalgias.  Neurological: Negative.  Negative for dizziness, focal weakness, seizures and headaches.  Psychiatric/Behavioral: Negative.  Negative for suicidal ideas. Hallucinations:     Past Medical History:  Diagnosis Date  . Hyperlipidemia   . Hypertension     History reviewed. No pertinent surgical history.  Family History  Problem Relation Age of Onset  . Diabetes Mother     Social History Reviewed with no changes to be made today.   Outpatient Medications Prior to Visit  Medication Sig Dispense Refill  . blood glucose meter kit and supplies KIT Dispense based on patient and insurance preference. Use up to four times daily as directed. (FOR ICD-9 250.00, 250.01). 1 each 0  . glimepiride (AMARYL) 2 MG tablet Take 1 tablet (2 mg total) by mouth daily before breakfast. 30 tablet 3  . metFORMIN (GLUCOPHAGE-XR) 500 MG 24 hr tablet Take 1 tablet (500 mg total) by mouth daily with breakfast. 90 tablet 1  . Cholecalciferol (VITAMIN D-3) 5000 units TABS Take 1 tablet by mouth daily. (Patient not taking: Reported on 03/02/2018) 30 tablet 0  . lovastatin  (MEVACOR) 40 MG tablet Take 1 tablet (40 mg total) by mouth at bedtime. (Patient not taking: Reported on 03/02/2018) 90 tablet 3   No facility-administered medications prior to visit.     No Known Allergies     Objective:    BP 125/83 (BP Location: Left Arm, Patient Position: Sitting, Cuff Size: Normal)   Pulse 82   Temp (!) 97.3 F (36.3 C) (Oral)   Ht '5\' 2"'$  (1.575 m)   Wt 164 lb 9.6 oz (74.7 kg)   SpO2 97%   BMI 30.11 kg/m  Wt Readings from Last 3 Encounters:  03/02/18 164 lb 9.6 oz (74.7 kg)  12/01/17 163 lb (73.9 kg)  06/11/17 150 lb 6.4 oz (68.2 kg)    Physical Exam  Constitutional: She is oriented to person, place, and time. She appears well-developed and well-nourished. She is cooperative.  HENT:  Head: Normocephalic and atraumatic.  Eyes: EOM are normal.  Neck: Normal range of motion.  Cardiovascular: Normal rate, regular rhythm and normal heart sounds. Exam reveals no gallop and no friction rub.  No murmur heard. Pulmonary/Chest: Effort normal and breath sounds normal. No tachypnea. No respiratory distress. She has no decreased breath sounds. She has no wheezes. She has  no rhonchi. She has no rales. She exhibits no tenderness.  Abdominal: Bowel sounds are normal.  Musculoskeletal: Normal range of motion. She exhibits no edema.  Neurological: She is alert and oriented to person, place, and time. Coordination normal.  Skin: Skin is warm and dry.  Psychiatric: She has a normal mood and affect. Her behavior is normal. Judgment and thought content normal.  Nursing note and vitals reviewed.      Patient has been counseled extensively about nutrition and exercise as well as the importance of adherence with medications and regular follow-up. The patient was given clear instructions to go to ER or return to medical center if symptoms don't improve, worsen or new problems develop. The patient verbalized understanding.   Follow-up: Return for PAP SMEAR and needs 76mh f/u for  diabetes.   ZGildardo Pounds FNP-BC [facility]  03/02/2018, 10:51 AM

## 2018-03-03 LAB — MICROALBUMIN / CREATININE URINE RATIO
Creatinine, Urine: 21.4 mg/dL
Microalb/Creat Ratio: <14 mg/g creat (ref 0.0–30.0)

## 2018-03-03 LAB — VITAMIN D 25 HYDROXY (VIT D DEFICIENCY, FRACTURES): Vit D, 25-Hydroxy: 14.7 ng/mL — ABNORMAL LOW (ref 30.0–100.0)

## 2018-03-09 ENCOUNTER — Other Ambulatory Visit: Payer: Self-pay | Admitting: Nurse Practitioner

## 2018-03-09 MED ORDER — VITAMIN D (ERGOCALCIFEROL) 1.25 MG (50000 UNIT) PO CAPS: 50000.0000 [IU] | ORAL_CAPSULE | ORAL | 1 refills | Status: DC

## 2018-03-13 ENCOUNTER — Telehealth (INDEPENDENT_AMBULATORY_CARE_PROVIDER_SITE_OTHER): Payer: Self-pay

## 2018-03-13 NOTE — Telephone Encounter (Signed)
-----   Message from Claiborne Rigg, NP sent at 03/09/2018  6:21 PM EDT ----- Kidney function has not been affected by diabetes at this time. Your vitamin d level is low. You will need to take vitamin d weekly for the next 3 mths. Prescription as been sent to the pharmacy.

## 2018-03-13 NOTE — Telephone Encounter (Signed)
Patient is aware that kidney function has not been affected by her diabetes at this time. Vitamin D level is low, take vitamin D weekly for the next 3 months, prescription has been sent to [facility] pharmacy.    Called placed using Larene Pickett [phone]) with pacific interpreters

## 2018-06-02 ENCOUNTER — Other Ambulatory Visit (HOSPITAL_COMMUNITY)
Admission: RE | Admit: 2018-06-02 | Discharge: 2018-06-02 | Disposition: A | Discharge status: Home / Self Care | Payer: Self-pay | Source: Ambulatory Visit | Attending: Physician Assistant | Admitting: Physician Assistant

## 2018-06-02 ENCOUNTER — Encounter (INDEPENDENT_AMBULATORY_CARE_PROVIDER_SITE_OTHER): Payer: Self-pay | Admitting: Physician Assistant

## 2018-06-02 ENCOUNTER — Other Ambulatory Visit: Payer: Self-pay

## 2018-06-02 ENCOUNTER — Ambulatory Visit (INDEPENDENT_AMBULATORY_CARE_PROVIDER_SITE_OTHER): Payer: Self-pay | Admitting: Physician Assistant

## 2018-06-02 VITALS — BP 109/72 | HR 68 | Temp 98.0°F | Ht 62.0 in | Wt 159.0 lb

## 2018-06-02 DIAGNOSIS — Z124 Encounter for screening for malignant neoplasm of cervix: Secondary | ICD-10-CM

## 2018-06-02 DIAGNOSIS — R102 Pelvic and perineal pain: Secondary | ICD-10-CM

## 2018-06-02 DIAGNOSIS — Z760 Encounter for issue of repeat prescription: Secondary | ICD-10-CM

## 2018-06-02 DIAGNOSIS — Z23 Encounter for immunization: Secondary | ICD-10-CM

## 2018-06-02 DIAGNOSIS — Z309 Encounter for contraceptive management, unspecified: Secondary | ICD-10-CM

## 2018-06-02 DIAGNOSIS — E119 Type 2 diabetes mellitus without complications: Secondary | ICD-10-CM

## 2018-06-02 DIAGNOSIS — E782 Mixed hyperlipidemia: Secondary | ICD-10-CM

## 2018-06-02 LAB — POCT URINE PREGNANCY: PREG TEST UR: NEGATIVE

## 2018-06-02 LAB — POCT GLYCOSYLATED HEMOGLOBIN (HGB A1C): Hemoglobin A1C: 5.5 % (ref 4.0–5.6)

## 2018-06-02 MED ORDER — VITAMIN D 1000 UNITS PO TABS: 1000.0000 [IU] | ORAL_TABLET | Freq: Every day | ORAL | 5 refills | Status: DC

## 2018-06-02 MED ORDER — METFORMIN HCL ER 500 MG PO TB24: 500.0000 mg | ORAL_TABLET | Freq: Every day | ORAL | 5 refills | Status: DC

## 2018-06-02 MED ORDER — PRAVASTATIN SODIUM 40 MG PO TABS: 40.0000 mg | ORAL_TABLET | Freq: Every day | ORAL | 5 refills | Status: DC

## 2018-06-02 NOTE — Patient Instructions (Signed)
Elección del método anticonceptivo  (Contraception Choices)  La anticoncepción (control de la natalidad) es el uso de cualquier método o dispositivo para evitar el embarazo. A continuación se indican algunos de esos métodos.  ANTICONCEPTIVOS HORMONALES  · Un pequeño tubo colocado bajo la piel de la parte superior del brazo (implante). El tubo puede permanecer en el lugar durante 3 años. El implante debe quitarse después de 3 años.  · Inyecciones que se aplican cada 3 meses.  · Píldoras que deben tomarse todos los días.  · Parches que se cambian una vez por semana.  · Un anillo que se coloca en la vagina (anillo vaginal). El anillo se deja en su lugar durante 3 semanas y se retira durante 1 semana. Luego se coloca un nuevo anillo en la vagina.  · Píldoras para el control de la natalidad después de tener sexo (relaciones sexuales) sin protección.    ANTICONCEPTIVOS DE BARRERA  · Una cubierta delgada que se usa sobe el pene (condón masculino) que se coloca durante las relaciones sexuales.  · Una cubierta blanda y suelta que se coloca en la vagina (condón femenino) antes de las relaciones sexuales.  · Un dispositivo de goma que se aplica sobre el cuello del útero (diafragma). Este dispositivo debe ser hecho para usted. Se coloca en la vagina antes de tener relaciones sexuales. Debe dejarlo colocado en la vagina durante 6 a 8 horas después de las relaciones sexuales.  · Un capuchón pequeño y suave que se fija sobre el cuello del útero (capuchón cervical). Este capuchón debe ser hecho para usted. Debe dejarlo colocado en la vagina durante 48 horas después de las relaciones sexuales.  · Una esponja que se coloca en la vagina antes de tener relaciones sexuales.  · Una sustancia química que destruye o impide que los espermatozoides ingresen al cuello y al útero (espermicida). La sustancia química puede ser en crema, gel, espuma o píldoras.    DISPOSITIVO DE CONTROL INTRAUTERINO (DIU)   · El DIU es un pequeño dispositivo plástico en forma de T. Se coloca dentro del útero. Hay dos tipos de DIU:  ? DIU de cobre. El dispositivo está recubierto en alambre de cobre. El cobre produce un líquido que destruye los espermatozoides. Puede permanecer colocado durante 10 años.  ? DIU hormonal. La hormona impide que ocurra el embarazo. Puede permanecer colocado durante 5 años.    MÉTODOS PERMANENTES  · La mujer puede hacerse sellar, ligar u obstruir las trompas de Falopio durante una cirugía. Esto impide que el óvulo llegue hasta el útero.  · El médico coloca un alambre diminuto o lo inserta en cada una de las trompas de Falopio. Esto produce un tejido cicatrizal que obstruye las trompas de Falopio.  · En el hombre pueden ligarse los conductos por los que pasan los espermatozoides (vasectomía).    CONTROL DE LA NATALIDAD POR PLANIFICACIÓN FAMILIAR NATURAL  · La planificación familiar natural significa no tener relaciones sexuales o usar un método anticonceptivo de barrera en los períodos fértiles de la mujer.  · Use a un almanaque para llevar un registro de la extensión de cada período y para conocer los días en los que puede quedar embarazada.  · Evite tener relaciones sexuales durante la ovulación.  · Use un termómetro para medir la temperatura corporal. También reconozca los síntomas de la ovulación.  · El momento de tener relaciones sexuales debe ser después de que la mujer haya ovulado.  Use condones para protegerse de las enfermedades de transmisión   sexual (ETS). Hágalo independientemente del tipo de anticonceptivo que use. Hable con su médico acerca de cuál es el mejor método anticonceptivo para usted.  Esta información no tiene como fin reemplazar el consejo del médico. Asegúrese de hacerle al médico cualquier pregunta que tenga.  Document Released: 01/15/2011 Document Revised: 10/05/2013 Document Reviewed: 04/21/2013  Elsevier Interactive Patient Education © 2017 Elsevier Inc.

## 2018-06-02 NOTE — Progress Notes (Signed)
Subjective:  Patient ID: Lindsay Hunt, female    DOB: [birth year]  Age: 32 y.o. MRN: [id number]  CC: f/u DM and pap  HPI Lindsay Hunt is a 32 y.o. female with a medical history of DM2 and HLD presents for DM2 f/u and Pap smear. Reports having stopped her anti-gylemics one week ago. States she has insomnia due to either metformin or glimepiride. Has not tried to stop one or the other to assess which may be causing insomnia. Pt states she is irritable and frustrated frequently. PHQ9 zero and GAD7 zero. Pt complains of weight gain of 20 lbs over a year. Does not why because she has changed her diet to include more vegetables and less carbs. Exercises one hour 3-4 times per week. Walks for exercise, no other cardio. A1c 11.3% at commencement of care here. A1c 5.6% three months ago. Also states she has stopped taking Lovastatin because she felt abdominal pain and diarrhea. Lastly, she had Nexplanon removed because of acne and hair loss. Wants to know if she can get pregnant now that Nexplanon is out. Does not desire pregnancy. Has been having protected sex with condoms. Complains of painful menstrual periods with discharge of clots. No metrorrhagia, dyspareunia, vaginal discharge, genital lesions, f/c/n/v, rash, or hx of coagulopathies. Pt does not endorse any other symptoms or complaints.     Outpatient Medications Prior to Visit  Medication Sig Dispense Refill  . blood glucose meter kit and supplies KIT Dispense based on patient and insurance preference. Use up to four times daily as directed. (FOR ICD-9 250.00, 250.01). 1 each 0  . glimepiride (AMARYL) 2 MG tablet Take 1 tablet (2 mg total) by mouth daily before breakfast. 90 tablet 1  . metFORMIN (GLUCOPHAGE-XR) 500 MG 24 hr tablet Take 1 tablet (500 mg total) by mouth daily with breakfast. 90 tablet 1  . Vitamin D, Ergocalciferol, (DRISDOL) 50000 units CAPS capsule Take 1 capsule (50,000 Units total) by mouth every 7 (seven) days. 12 capsule 1  .  Cholecalciferol (VITAMIN D-3) 5000 units TABS Take 1 tablet by mouth daily. (Patient not taking: Reported on 03/02/2018) 30 tablet 0  . lovastatin (MEVACOR) 40 MG tablet Take 1 tablet (40 mg total) by mouth at bedtime. (Patient not taking: Reported on 06/02/2018) 90 tablet 3   No facility-administered medications prior to visit.      ROS Review of Systems  Constitutional: Negative for chills, fever and malaise/fatigue.  Eyes: Negative for blurred vision.  Respiratory: Negative for shortness of breath.   Cardiovascular: Negative for chest pain and palpitations.  Gastrointestinal: Negative for abdominal pain and nausea.  Genitourinary: Negative for dysuria and hematuria.  Musculoskeletal: Negative for joint pain and myalgias.  Skin: Negative for rash.  Neurological: Negative for tingling and headaches.  Psychiatric/Behavioral: Negative for depression. The patient is not nervous/anxious.        Irritable    Objective:  BP 109/72 (BP Location: Left Arm, Patient Position: Sitting, Cuff Size: Normal)   Pulse 68   Temp 98 F (36.7 C) (Oral)   Ht '5\' 2"'$  (1.575 m)   Wt 159 lb (72.1 kg)   LMP 05/29/2018 (Exact Date)   SpO2 94%   BMI 29.08 kg/m   BP/Weight 06/02/2018 03/02/2018 3/47/4259  Systolic BP 563 875 643  Diastolic BP 72 83 76  Wt. (Lbs) 159 164.6 150.4  BMI 29.08 30.11 27.51  Some encounter information is confidential and restricted. Go to Review Flowsheets activity to see all data.  Physical Exam  Constitutional: She is oriented to person, place, and time.  Well developed, well nourished, NAD, reserved  HENT:  Head: Normocephalic and atraumatic.  Eyes: No scleral icterus.  Neck: Normal range of motion. Neck supple. No thyromegaly present.  Cardiovascular: Normal rate, regular rhythm and normal heart sounds.  Pulmonary/Chest: Effort normal and breath sounds normal.  Musculoskeletal: She exhibits no edema.  Neurological: She is alert and oriented to person, place,  and time.  Skin: Skin is warm and dry. No rash noted. No erythema. No pallor.  Psychiatric: Her behavior is normal. Thought content normal.  Reserved, constricted affect, defensive body language  Vitals reviewed.    Assessment & Plan:   1. Type 2 diabetes mellitus without complication, without long-term current use of insulin (HCC) - Hgb A1c POCT 5.5% today - Suspend Glimepiride due to patients complaint of "insomnia".  - Continue Metformin. - BMP  2. Need for pneumococcal vaccination - Pneumococcal polysaccharide vaccine 23-valent greater than or equal to 2yo subcutaneous/IM  3. Need for influenza vaccination - Flu Vaccine QUAD 6+ mos PF IM (Fluarix Quad PF)  4. Mixed hyperlipidemia - Stop lovastatin due to complaint of abdominal pain and diarrhea. - pravastatin (PRAVACHOL) 40 MG tablet; Take 1 tablet (40 mg total) by mouth daily.  Dispense: 30 tablet; Refill: 5  5. Screening for cervical cancer - Cytology - PAP([place])  6. Encounter for contraceptive management, unspecified type - POCT urine pregnancy negative. - Advised to continue using condoms until she can decide what other form of contrecption she would like to use. I have printed contraception choices information for patient.   7. Pelvic pain in female - US pelvic complete - US transvaginal  8. Medication refill - Vitamin D 1000 mg one tablet qday #30, 5 refills.    Meds ordered this encounter  Medications  . pravastatin (PRAVACHOL) 40 MG tablet    Sig: Take 1 tablet (40 mg total) by mouth daily.    Dispense:  30 tablet    Refill:  5    Order Specific Question:   Supervising Provider    Answer:   Charlott Rakes [[id number]]    Follow-up: No follow-ups on file.   Clent Demark PA

## 2018-06-03 ENCOUNTER — Telehealth (INDEPENDENT_AMBULATORY_CARE_PROVIDER_SITE_OTHER): Payer: Self-pay

## 2018-06-03 LAB — CYTOLOGY - PAP
Bacterial vaginitis: NEGATIVE
CANDIDA VAGINITIS: NEGATIVE
Chlamydia: NEGATIVE
Diagnosis: NEGATIVE
NEISSERIA GONORRHEA: NEGATIVE
Trichomonas: NEGATIVE

## 2018-06-03 LAB — BASIC METABOLIC PANEL
BUN / CREAT RATIO: 21 (ref 9–23)
BUN: 12 mg/dL (ref 6–20)
CALCIUM: 9.3 mg/dL (ref 8.7–10.2)
CHLORIDE: 105 mmol/L (ref 96–106)
CO2: 22 mmol/L (ref 20–29)
Creatinine, Ser: 0.58 mg/dL (ref 0.57–1.00)
GFR calc Af Amer: 139 mL/min/{1.73_m2} (ref >59)
GFR calc non Af Amer: 121 mL/min/{1.73_m2} (ref >59)
GLUCOSE: 116 mg/dL — AB (ref 65–99)
POTASSIUM: 4.1 mmol/L (ref 3.5–5.2)
Sodium: 140 mmol/L (ref 134–144)

## 2018-06-03 NOTE — Telephone Encounter (Signed)
Call placed using pacific interpreter Alejandro([phone]) patient is aware that kidney result is normal. Maryjean Mornempestt S Caley Volkert, CMA

## 2018-06-03 NOTE — Telephone Encounter (Signed)
-----   Message from Loletta Specteroger David Gomez, PA-C sent at 06/03/2018  9:17 AM EDT ----- Kidney result normal.

## 2018-06-04 ENCOUNTER — Telehealth (INDEPENDENT_AMBULATORY_CARE_PROVIDER_SITE_OTHER): Payer: Self-pay

## 2018-06-04 NOTE — Telephone Encounter (Signed)
Call placed using pacific interpreter [name]([phone]) patient is aware of completely normal pap. Lindsay Hunt S Treshun Wold, CMA

## 2018-06-04 NOTE — Telephone Encounter (Signed)
-----   Message from Loletta Specteroger David Gomez, PA-C sent at 06/03/2018  5:27 PM EDT ----- PAP completely normal.

## 2018-06-05 ENCOUNTER — Ambulatory Visit (HOSPITAL_COMMUNITY)
Admission: RE | Admit: 2018-06-05 | Discharge: 2018-06-05 | Disposition: A | Discharge status: Home / Self Care | Payer: Self-pay | Source: Ambulatory Visit | Attending: Physician Assistant | Admitting: Physician Assistant

## 2018-06-05 DIAGNOSIS — R102 Pelvic and perineal pain: Secondary | ICD-10-CM | POA: Insufficient documentation

## 2018-06-08 ENCOUNTER — Telehealth (INDEPENDENT_AMBULATORY_CARE_PROVIDER_SITE_OTHER): Payer: Self-pay

## 2018-06-08 NOTE — Telephone Encounter (Signed)
-----   Message from Loletta Specteroger David Gomez, PA-C sent at 06/08/2018  8:44 AM EDT ----- Normal pelvic and transvaginal ultrasound.

## 2018-06-08 NOTE — Telephone Encounter (Signed)
Call placed using pacific interpreter [name]([phone]) patient is aware that pelvic and transvaginal ultrasounds are normal. Lindsay Hunt Budd PalmerS Roberts, CMA

## 2018-08-13 ENCOUNTER — Other Ambulatory Visit (HOSPITAL_COMMUNITY): Payer: Self-pay | Admitting: Family

## 2018-08-13 DIAGNOSIS — O3680X0 Pregnancy with inconclusive fetal viability, not applicable or unspecified: Secondary | ICD-10-CM

## 2018-08-13 DIAGNOSIS — Z3682 Encounter for antenatal screening for nuchal translucency: Secondary | ICD-10-CM

## 2018-08-13 DIAGNOSIS — O09521 Supervision of elderly multigravida, first trimester: Secondary | ICD-10-CM

## 2018-08-13 DIAGNOSIS — Z3A13 13 weeks gestation of pregnancy: Secondary | ICD-10-CM

## 2018-08-13 LAB — OB RESULTS CONSOLE GC/CHLAMYDIA
CHLAMYDIA, DNA PROBE: NEGATIVE
GC PROBE AMP, GENITAL: NEGATIVE

## 2018-08-13 LAB — OB RESULTS CONSOLE RPR: RPR: NONREACTIVE

## 2018-08-13 LAB — AMB REFERRAL TO OB-GYN: URINE CULTURE, OB: NEGATIVE

## 2018-08-13 LAB — OB RESULTS CONSOLE HGB/HCT, BLOOD
HCT: 37 (ref 29–41)
HEMOGLOBIN: 13.4

## 2018-08-13 LAB — OB RESULTS CONSOLE ABO/RH: RH TYPE: POSITIVE

## 2018-08-13 LAB — OB RESULTS CONSOLE ANTIBODY SCREEN: ANTIBODY SCREEN: NEGATIVE

## 2018-08-13 LAB — OB RESULTS CONSOLE PLATELET COUNT: Platelets: 242

## 2018-08-18 ENCOUNTER — Ambulatory Visit (INDEPENDENT_AMBULATORY_CARE_PROVIDER_SITE_OTHER): Payer: Self-pay | Admitting: Family Medicine

## 2018-08-18 ENCOUNTER — Ambulatory Visit (HOSPITAL_COMMUNITY)
Admission: RE | Admit: 2018-08-18 | Discharge: 2018-08-18 | Disposition: A | Discharge status: Home / Self Care | Payer: Self-pay | Source: Ambulatory Visit | Attending: Family | Admitting: Family

## 2018-08-18 ENCOUNTER — Encounter: Payer: Self-pay | Admitting: Family Medicine

## 2018-08-18 VITALS — BP 108/66 | HR 93 | Ht 62.0 in | Wt 157.2 lb

## 2018-08-18 DIAGNOSIS — O3680X0 Pregnancy with inconclusive fetal viability, not applicable or unspecified: Secondary | ICD-10-CM | POA: Insufficient documentation

## 2018-08-18 NOTE — Progress Notes (Signed)
Pt states has been having bleeding mixed with fluid since Sunday.

## 2018-08-18 NOTE — Progress Notes (Signed)
   Subjective:    Lindsay Hunt - 32 y.o. female MRN [id number]  Date of birth: May 08, 1986  HPI  Lindsay Hunt is a 32 y.o. G5P3003 female here for [place] results. She reports that she had a positive urine preg test at the [facility].  At her initial Ob visit, there were no fetal heart tones, which prompted [place]. There was no cardiac movement on [place]. She reports that she's had some mild bleeding, but no pelvic pain or cramping. This is a desired pregnancy.  She feels that her pregnancy might be earlier than initially reported, so she'd like to wait to do anything that might end a desired pregnancy. After some discussion, she elects to have serial hcg blood draws instead of initiating a cytotec protocol.    OB History    Gravida  3   Para  3   Term  3   Preterm      AB      Living  3     SAB      TAB      Ectopic      Multiple      Live Births  3             Health Maintenance:  Normal pap on 06/02/2018.    Health Maintenance:   Health Maintenance Due  Topic Date Due  . OPHTHALMOLOGY EXAM  12/15/1993    -  reports that she has never smoked. She has never used smokeless tobacco. - Review of Systems: Per HPI. - Past Medical History: There are no active problems to display for this patient.  - Medications: reviewed and updated   Objective:   Physical Exam BP 108/66   Pulse 93   Ht 5\' 2"  (1.575 m)   Wt 157 lb 3.2 oz (71.3 kg)   BMI 28.75 kg/m  Gen: NAD, alert, cooperative with exam, well-appearing HEENT: NCAT, PER,clear conjunctiva CV: RRR Resp: Non-labored breathing  Skin: no rashes, normal turgor  Neuro: no gross deficits.  Psych: good insight, alert and oriented     Assessment & Plan:   1. Pregnancy with inconclusive fetal viability, single or unspecified fetus - No cardiac activity on [place], however pt would like serial hcgs instead of MAB protocol  - Discussed options for management of incomplete/missed AB including expectant management, Cytotec or D&C. Prefers  expectant management at this time. Verbalizes understanding that intervention may become necessary if SAB in not completed spontaneously or if heavy bleeding or infection occur.  - Beta hCG quant (ref lab); Future - Beta hCG quant (ref lab)   Routine preventative health maintenance measures emphasized. Please refer to After Visit Summary for other counseling recommendations.   Return in about 2 weeks (around 09/01/2018) for Repeat labs/US if needed .  Gwenevere Abbot, MD OB Fellow  08/18/2018, 3:49 PM

## 2018-08-19 ENCOUNTER — Encounter: Payer: Self-pay | Admitting: *Deleted

## 2018-08-19 ENCOUNTER — Encounter (HOSPITAL_COMMUNITY): Payer: Self-pay

## 2018-08-19 ENCOUNTER — Encounter: Payer: Self-pay | Admitting: Family Medicine

## 2018-08-19 LAB — BETA HCG QUANT (REF LAB): HCG QUANT: 5875 m[IU]/mL

## 2018-08-20 ENCOUNTER — Other Ambulatory Visit: Payer: Self-pay

## 2018-08-20 ENCOUNTER — Encounter: Payer: Self-pay | Admitting: Obstetrics & Gynecology

## 2018-08-20 ENCOUNTER — Ambulatory Visit (INDEPENDENT_AMBULATORY_CARE_PROVIDER_SITE_OTHER): Payer: Self-pay | Admitting: Obstetrics & Gynecology

## 2018-08-20 VITALS — BP 113/81 | HR 81 | Ht 62.0 in | Wt 156.1 lb

## 2018-08-20 DIAGNOSIS — O3680X0 Pregnancy with inconclusive fetal viability, not applicable or unspecified: Secondary | ICD-10-CM

## 2018-08-20 DIAGNOSIS — O021 Missed abortion: Secondary | ICD-10-CM | POA: Insufficient documentation

## 2018-08-20 MED ORDER — MISOPROSTOL 200 MCG PO TABS: ORAL_TABLET | ORAL | 0 refills | Status: DC

## 2018-08-20 NOTE — Progress Notes (Signed)
Pt states has been having back pain.

## 2018-08-20 NOTE — Patient Instructions (Signed)
Aborto espontáneo °(Miscarriage) °El aborto espontáneo es la pérdida de un bebé que no ha nacido (feto) antes de la semana 20 del embarazo. La mayor parte de estos abortos ocurre en los primeros 3 meses. En algunos casos ocurre antes de que la mujer sepa que está embarazada. También se denomina "aborto espontáneo" o "pérdida prematura del embarazo". El aborto espontáneo puede ser una experiencia que afecte emocionalmente a la persona. Converse con su médico si tiene dudas, cómo es el proceso de duelo, y sobre planes futuros de embarazo. °CAUSAS °· Algunos problemas cromosómicos pueden hacer imposible que el bebé se desarrolle normalmente. Los problemas con los genes o cromosomas del bebé son generalmente el resultado de errores que se producen, por casualidad, cuando el embrión se divide y crece. Estos problemas no se heredan de los padres. °· Infección en el cuello del útero. °· Problemas hormonales. °· Problemas en el cuello del útero, como tener un útero incompetente. Esto ocurre cuando los tejidos no son lo suficientemente fuertes como para contener el embarazo. °· Problemas del útero, como un útero con forma anormal, los fibromas o anormalidades congénitas. °· Ciertas enfermedades crónicas. °· No fume, no beba alcohol, ni consuma drogas. °· Traumatismos °A veces, la causa es desconocida. °SÍNTOMAS °· Sangrado o manchado vaginal, con o sin cólicos o dolor. °· Dolor o cólicos en el abdomen o en la cintura. °· Eliminación de líquido, tejidos o coágulos grandes por la vagina. ° °DIAGNÓSTICO °El médico le hará un examen físico. También le indicará una ecografía para confirmar el aborto. Es posible que se realicen análisis de sangre. °TRATAMIENTO °· En algunos casos el tratamiento no es necesario, si se eliminan naturalmente todos los tejidos embrionarios que se encontraban en el útero. Si el feto o la placenta quedan dentro del útero (aborto incompleto), pueden infectarse, los tejidos que quedan pueden infectarse y  deben retirarse. Generalmente se realiza un procedimiento de dilatación y curetaje (D y C). Durante el procedimiento de dilatación y curetaje, el cuello del útero se abre (dilata) y se retira cualquier resto de tejido fetal o placentario del útero. °· Si hay una infección, le recetarán antibióticos. Podrán recetarle otros medicamentos para reducir el tamaño del útero (contraerlo) si hay una mucho sangrado. °· Si su sangre es Rh negativa y su bebé es Rh positivo, usted necesitará la inyección de inmunoglobulina Rh. Esta inyección protegerá a los futuros bebés de tener problemas de compatibilidad Rh en futuros embarazos. ° °INSTRUCCIONES PARA EL CUIDADO EN EL HOGAR °· El médico le indicará reposo en cama o le permitirá realizar actividades livianas. Vuelva a la actividad lentamente o según las indicaciones de su médico. °· Pídale a alguien que la ayude con las responsabilidades familiares y del hogar durante este tiempo. °· Lleve un registro de la cantidad y la saturación de las toallas higiénicas que utiliza cada día. Anote esta información °· No use tampones. No No se haga duchas vaginales ni tenga relaciones sexuales hasta que el médico la autorice. °· Sólo tome medicamentos de venta libre o recetados para calmar el dolor o el malestar, según las indicaciones de su médico. °· No tome aspirina. La aspirina puede ocasionar hemorragias. °· Concurra puntualmente a las citas de control con el médico. °· Si usted o su pareja tienen dificultades con el duelo, hable con su médico para buscar la ayuda psicológica que los ayude a enfrentar la pérdida del embarazo. Permítase el tiempo suficiente de duelo antes de quedar embarazada nuevamente. ° °SOLICITE ATENCIÓN MÉDICA DE INMEDIATO SI: °·   Siente calambres intensos o dolor en la espalda o en el abdomen. °· Tiene fiebre. °· Elimina grandes coágulos de sangre (del tamaño de una nuez o más) o tejidos por la vagina. Guarde lo que ha eliminado para que su médico lo examine. °· La  hemorragia aumenta. °· Observa una secreción vaginal espesa y con mal olor. °· Se siente mareada, débil, o se desmaya. °· Siente escalofríos. ° °ASEGÚRESE DE QUE: °· Comprende estas instrucciones. °· Controlará su enfermedad. °· Solicitará ayuda de inmediato si no mejora o si empeora. ° °Esta información no tiene como fin reemplazar el consejo del médico. Asegúrese de hacerle al médico cualquier pregunta que tenga. °Document Released: 07/10/2005 Document Revised: 01/25/2013 Document Reviewed: 11/19/2011 °Elsevier Interactive Patient Education © 2017 [employer] ° °

## 2018-08-20 NOTE — Progress Notes (Signed)
Ultrasounds Results Note  SUBJECTIVE HPI:  Lindsay Hunt is a 32 y.o. [phone] at 86w3dby LMP and with [place] days ago showing failed pregnancy 7.1 weekswho [facility]. The patient has some abdominal pain, no vaginal bleeding.  Dr Phillip's note was reviewed  Past Medical History:  Diagnosis Date  . Diabetes mellitus without complication ([place]   . Hyperlipidemia   . Hypertension    Past Surgical History:  Procedure Laterality Date  . NO PAST SURGERIES     Social History   Socioeconomic History  . Marital status: Married    Spouse name: Not on file  . Number of children: Not on file  . Years of education: Not on file  . Highest education level: Not on file  Occupational History  . Not on file  Social Needs  . Financial resource strain: Not on file  . Food insecurity:    Worry: Not on file    Inability: Not on file  . Transportation needs:    Medical: Not on file    Non-medical: Not on file  Tobacco Use  . Smoking status: Never Smoker  . Smokeless tobacco: Never Used  Substance and Sexual Activity  . Alcohol use: No  . Drug use: Never  . Sexual activity: Yes    Birth control/protection: Implant  Lifestyle  . Physical activity:    Days per week: Not on file    Minutes per session: Not on file  . Stress: Not on file  Relationships  . Social connections:    Talks on phone: Not on file    Gets together: Not on file    Attends religious service: Not on file    Active member of club or organization: Not on file    Attends meetings of clubs or organizations: Not on file    Relationship status: Not on file  . Intimate partner violence:    Fear of current or ex partner: Not on file    Emotionally abused: Not on file    Physically abused: Not on file    Forced sexual activity: Not on file  Other Topics Concern  . Not on file  Social History Narrative  . Not on file   Current Outpatient Medications on File Prior to  Visit  Medication Sig Dispense Refill  . blood glucose meter kit and supplies KIT Dispense based on patient and insurance preference. Use up to four times daily as directed. (FOR ICD-9 250.00, 250.01). 1 each 0  . metFORMIN (GLUCOPHAGE-XR) 500 MG 24 hr tablet Take 1 tablet (500 mg total) by mouth daily with breakfast. 30 tablet 5  . cholecalciferol (VITAMIN D) 1000 units tablet Take 1 tablet (1,000 Units total) by mouth daily. (Patient not taking: Reported on 08/20/2018) 30 tablet 5  . pravastatin (PRAVACHOL) 40 MG tablet Take 1 tablet (40 mg total) by mouth daily. (Patient not taking: Reported on 08/18/2018) 30 tablet 5   No current facility-administered medications on file prior to visit.    No Known Allergies  I have reviewed patient's Past Medical Hx, Surgical Hx, Family Hx, Social Hx, medications and allergies.   Review of Systems Review of Systems  Constitutional: Negative for fever and chills.  Gastrointestinal: Negative for nausea, vomiting, abdominal pain, diarrhea and constipation.  Genitourinary: Negative for dysuria.  Musculoskeletal: Negative for back pain.  Neurological: Negative for dizziness and weakness.    Physical Exam  BP 113/81   Pulse 81  Ht '5\' 2"'$  (1.575 m)   Wt 70.8 kg   LMP 05/25/2018 Comment: SAB  Breastfeeding? Unknown   BMI 28.55 kg/m   GENERAL: Well-developed, well-nourished female in no acute distress.  HEENT: Normocephalic, atraumatic.   LUNGS: Effort normal HEART: Regular rate  SKIN: Warm, dry and without erythema PSYCH: Normal mood and affect NEURO: Alert and oriented x 4  LAB RESULTS No results found for this or any previous visit (from the past 24 hour(s)).  IMAGING US Ob Comp Less 14 Wks  Result Date: 08/18/2018 CLINICAL DATA:  First trimester pregnancy with inconclusive fetal viability. EXAM: OBSTETRIC <14 WK [place] AND TRANSVAGINAL OB US TECHNIQUE: Both transabdominal and transvaginal ultrasound examinations were performed for complete  evaluation of the gestation as well as the maternal uterus, adnexal regions, and pelvic cul-de-sac. Transvaginal technique was performed to assess early pregnancy. COMPARISON:  None. FINDINGS: Intrauterine gestational sac: Single Yolk sac:  Not Visualized. Embryo:  Visualized. Cardiac Activity: Not Visualized. CRL:  10 mm   7 w   1 d                  [place] EDC: 04/05/2019 Subchorionic hemorrhage:  Small subchorionic hemorrhage. Maternal uterus/adnexae: Retroflexed uterus. Normal appearance of both ovaries. No adnexal mass or abnormal free fluid identified. IMPRESSION: Findings meet definitive criteria for failed IUP. This follows SRU consensus guidelines: Diagnostic Criteria for Nonviable Pregnancy Early in the First Trimester. Lindsay Hunt J Med [phone]. Electronically Signed   By: Earle Gell M.D.   On: 08/18/2018 11:08   US Ob Transvaginal  Result Date: 08/18/2018 CLINICAL DATA:  First trimester pregnancy with inconclusive fetal viability. EXAM: OBSTETRIC <14 WK [place] AND TRANSVAGINAL OB US TECHNIQUE: Both transabdominal and transvaginal ultrasound examinations were performed for complete evaluation of the gestation as well as the maternal uterus, adnexal regions, and pelvic cul-de-sac. Transvaginal technique was performed to assess early pregnancy. COMPARISON:  None. FINDINGS: Intrauterine gestational sac: Single Yolk sac:  Not Visualized. Embryo:  Visualized. Cardiac Activity: Not Visualized. CRL:  10 mm   7 w   1 d                  [place] EDC: 04/05/2019 Subchorionic hemorrhage:  Small subchorionic hemorrhage. Maternal uterus/adnexae: Retroflexed uterus. Normal appearance of both ovaries. No adnexal mass or abnormal free fluid identified. IMPRESSION: Findings meet definitive criteria for failed IUP. This follows SRU consensus guidelines: Diagnostic Criteria for Nonviable Pregnancy Early in the First Trimester. Lindsay Hunt J Med [phone]. Electronically Signed   By: Earle Gell M.D.   On: 08/18/2018 11:08     ASSESSMENT 1. Missed abortion     PLAN Discharge home in stable condition We discussed expectant management vs intervention and she requests medical management with Cytotec, 4 tabs 200 mg rx, 2 oral and 2 vaginal, report if excessive VB or pain, f/u as scheduled next week  Woodroe Mode, MD  08/20/2018  10:49 AM

## 2018-08-21 LAB — BETA HCG QUANT (REF LAB): HCG QUANT: 4611 m[IU]/mL

## 2018-08-25 ENCOUNTER — Ambulatory Visit (HOSPITAL_COMMUNITY): Payer: Self-pay

## 2018-08-26 ENCOUNTER — Ambulatory Visit (HOSPITAL_COMMUNITY)
Admission: RE | Admit: 2018-08-26 | Discharge: 2018-08-26 | Disposition: A | Discharge status: Home / Self Care | Payer: Self-pay | Source: Ambulatory Visit | Attending: Family | Admitting: Family

## 2018-08-26 ENCOUNTER — Ambulatory Visit (HOSPITAL_COMMUNITY): Admission: RE | Admit: 2018-08-26 | Payer: Self-pay | Source: Ambulatory Visit

## 2018-08-26 ENCOUNTER — Encounter (HOSPITAL_COMMUNITY): Payer: Self-pay

## 2018-08-26 DIAGNOSIS — O09521 Supervision of elderly multigravida, first trimester: Secondary | ICD-10-CM

## 2018-08-26 DIAGNOSIS — Z3682 Encounter for antenatal screening for nuchal translucency: Secondary | ICD-10-CM

## 2018-08-26 DIAGNOSIS — Z3A13 13 weeks gestation of pregnancy: Secondary | ICD-10-CM

## 2018-08-31 ENCOUNTER — Ambulatory Visit (INDEPENDENT_AMBULATORY_CARE_PROVIDER_SITE_OTHER): Payer: Self-pay | Admitting: Obstetrics & Gynecology

## 2018-08-31 ENCOUNTER — Encounter: Payer: Self-pay | Admitting: Obstetrics & Gynecology

## 2018-08-31 VITALS — BP 119/70 | HR 85 | Ht 62.0 in | Wt 156.1 lb

## 2018-08-31 DIAGNOSIS — O021 Missed abortion: Secondary | ICD-10-CM

## 2018-08-31 NOTE — Progress Notes (Signed)
Here for miscarriage follow up. Took cytotec as ordered. Had a lot of bleeding after that. C/o spotting only now.

## 2018-08-31 NOTE — Progress Notes (Signed)
Ultrasounds Results Note  SUBJECTIVE HPI:  [name] is a 32 y.o. [phone] at 64w0dby LMP [facility].Took cytotec as ordered. Had a lot of bleeding after that. C/o spotting only now. Past Medical History:  Diagnosis Date  . Diabetes mellitus without complication ([place]   . Hyperlipidemia   . Hypertension    Past Surgical History:  Procedure Laterality Date  . NO PAST SURGERIES     Social History   Socioeconomic History  . Marital status: Married    Spouse name: Not on file  . Number of children: Not on file  . Years of education: Not on file  . Highest education level: Not on file  Occupational History  . Not on file  Social Needs  . Financial resource strain: Not on file  . Food insecurity:    Worry: Not on file    Inability: Not on file  . Transportation needs:    Medical: Not on file    Non-medical: Not on file  Tobacco Use  . Smoking status: Never Smoker  . Smokeless tobacco: Never Used  Substance and Sexual Activity  . Alcohol use: No  . Drug use: Never  . Sexual activity: Not Currently    Birth control/protection: None  Lifestyle  . Physical activity:    Days per week: Not on file    Minutes per session: Not on file  . Stress: Not on file  Relationships  . Social connections:    Talks on phone: Not on file    Gets together: Not on file    Attends religious service: Not on file    Active member of club or organization: Not on file    Attends meetings of clubs or organizations: Not on file    Relationship status: Not on file  . Intimate partner violence:    Fear of current or ex partner: Not on file    Emotionally abused: Not on file    Physically abused: Not on file    Forced sexual activity: Not on file  Other Topics Concern  . Not on file  Social History Narrative  . Not on file   Current Outpatient Medications on File Prior to Visit  Medication Sig Dispense  Refill  . blood glucose meter kit and supplies KIT Dispense based on patient and insurance preference. Use up to four times daily as directed. (FOR ICD-9 250.00, 250.01). 1 each 0  . metFORMIN (GLUCOPHAGE-XR) 500 MG 24 hr tablet Take 1 tablet (500 mg total) by mouth daily with breakfast. 30 tablet 5  . cholecalciferol (VITAMIN D) 1000 units tablet Take 1 tablet (1,000 Units total) by mouth daily. (Patient not taking: Reported on 08/20/2018) 30 tablet 5  . misoprostol (CYTOTEC) 200 MCG tablet 2 tablets oral and 2 per vagina (Patient not taking: Reported on 08/26/2018) 4 tablet 0  . pravastatin (PRAVACHOL) 40 MG tablet Take 1 tablet (40 mg total) by mouth daily. (Patient not taking: Reported on 08/18/2018) 30 tablet 5   No current facility-administered medications on file prior to visit.    No Known Allergies  I have reviewed patient's Past Medical Hx, Surgical Hx, Family Hx, Social Hx, medications and allergies.   Review of Systems Review of Systems  Constitutional: Negative for fever and chills.  Gastrointestinal: Negative for nausea, vomiting, abdominal pain, diarrhea and constipation.  Genitourinary: Negative for dysuria.  Musculoskeletal: Negative for back pain.  Neurological: Negative for  dizziness and weakness.  Nasal congestion, cough c/w cold sx  Physical Exam  BP 119/70   Pulse 85   Ht _0  (1.575 m)   Wt 156 lb 1.6 oz (70.8 kg)   LMP 05/25/2018   BMI 28.55 kg/m   GENERAL: Well-developed, well-nourished female in no acute distress.  HEENT: Normocephalic, atraumatic.   LUNGS: Effort normal, CTA ABDOMEN: soft, non-tender HEART: Regular rate  SKIN: Warm, dry and without erythema PSYCH: Normal mood and affect NEURO: Alert and oriented x 4  LAB RESULTS No results found for this or any previous visit (from the past 24 hour(s)).  IMAGING US Ob Comp Less 14 Wks  Result Date: 08/18/2018 CLINICAL DATA:  First trimester pregnancy with inconclusive fetal viability. EXAM:  OBSTETRIC <14 WK [place] AND TRANSVAGINAL OB US TECHNIQUE: Both transabdominal and transvaginal ultrasound examinations were performed for complete evaluation of the gestation as well as the maternal uterus, adnexal regions, and pelvic cul-de-sac. Transvaginal technique was performed to assess early pregnancy. COMPARISON:  None. FINDINGS: Intrauterine gestational sac: Single Yolk sac:  Not Visualized. Embryo:  Visualized. Cardiac Activity: Not Visualized. CRL:  10 mm   7 w   1 d                  [place] EDC: 04/05/2019 Subchorionic hemorrhage:  Small subchorionic hemorrhage. Maternal uterus/adnexae: Retroflexed uterus. Normal appearance of both ovaries. No adnexal mass or abnormal free fluid identified. IMPRESSION: Findings meet definitive criteria for failed IUP. This follows SRU consensus guidelines: Diagnostic Criteria for Nonviable Pregnancy Early in the First Trimester. Alison Stalling J Med [phone]. Electronically Signed   By: Earle Gell M.D.   On: 08/18/2018 11:08   US Ob Transvaginal  Result Date: 08/18/2018 CLINICAL DATA:  First trimester pregnancy with inconclusive fetal viability. EXAM: OBSTETRIC <14 WK [place] AND TRANSVAGINAL OB US TECHNIQUE: Both transabdominal and transvaginal ultrasound examinations were performed for complete evaluation of the gestation as well as the maternal uterus, adnexal regions, and pelvic cul-de-sac. Transvaginal technique was performed to assess early pregnancy. COMPARISON:  None. FINDINGS: Intrauterine gestational sac: Single Yolk sac:  Not Visualized. Embryo:  Visualized. Cardiac Activity: Not Visualized. CRL:  10 mm   7 w   1 d                  [place] EDC: 04/05/2019 Subchorionic hemorrhage:  Small subchorionic hemorrhage. Maternal uterus/adnexae: Retroflexed uterus. Normal appearance of both ovaries. No adnexal mass or abnormal free fluid identified. IMPRESSION: Findings meet definitive criteria for failed IUP. This follows SRU consensus guidelines: Diagnostic Criteria for Nonviable  Pregnancy Early in the First Trimester. Alison Stalling J Med [phone]. Electronically Signed   By: Earle Gell M.D.   On: 08/18/2018 11:08    ASSESSMENT 1. Missed abortion   completed after cytotec 2. Contraception, wants Paragard, declines DMPA today due to cost. PLAN Discharge home in stable condition Will f/u at [facility] for Paragard, will use barrier method for [facility] until then  Woodroe Mode, MD  08/31/2018  10:48 AM

## 2018-08-31 NOTE — Patient Instructions (Signed)
Eleccin del mtodo anticonceptivo Contraception Choices La anticoncepcin, o los mtodos anticonceptivos, hace referencia a los mtodos o dispositivos que evitan el Gratis. Mtodos hormonales Implante anticonceptivo Un implante anticonceptivo consiste en un tubo plstico delgado que contiene una hormona. Se inserta en la parte superior del brazo. Puede Consulting civil engineer por 3 aos. Inyecciones de progestina sola Las inyecciones de progestina sola contienen progestina, una forma sinttica de la hormona progesterona. Un mdico las administra cada 3 meses. Pldoras anticonceptivas Las pldoras anticonceptivas son pastillas que contienen hormonas que evitan el [place]. Deben tomarse una vez al da, preferentemente a la misma Economist. Parches anticonceptivos El parche anticonceptivo contiene hormonas que evitan el [place]. Se coloca en la piel, debe cambiarse una vez a la semana durante tres semanas y debe retirarse en la cuarta semana. Se necesita una receta para utilizar este mtodo anticonceptivo. Anillo vaginal Un anillo vaginal contiene hormonas que evitan el embarazo. Se coloca en la vagina durante tres semanas y se retira en la cuarta semana. Luego se repite el proceso con un anillo nuevo. Se necesita una receta para utilizar este mtodo anticonceptivo. Anticonceptivo de Associate Professor Los anticonceptivos de emergencia evitan el embarazo despus de Warehouse manager sexo sin proteccin. Vienen en forma de pldora y pueden tomarse hasta 5 das despus de [place]. Funcionan mejor cuando se toman lo ms pronto posible luego de [employer]. La mayora de los anticonceptivos de emergencia estn disponibles sin receta mdica. Este mtodo no debe utilizarse como el nico mtodo anticonceptivo. Mtodos de barrera Preservativo masculino Un preservativo masculino es una vaina delgada que se coloca sobre el pene durante el sexo. Los preservativos evitan que el esperma ingrese en el cuerpo de la  [place]. Pueden utilizarse con un espermicida para aumentar la efectividad. Deben desecharse luego de su uso. Preservativo femenino Un preservativo femenino es una vaina blanda y holgada que se coloca en la vagina antes de [place]. El preservativo evita que el esperma ingrese en el cuerpo de la [place]. Deben desecharse luego de su uso. Diafragma Un diafragma es una barrera blanda con forma de cpula. Se inserta en la vagina antes del sexo, junto con un espermicida. El diafragma bloquea el ingreso de esperma en el tero, y el espermicida mata a los espermatozoides. El Designer, fashion/clothing en la vagina durante 6 a 8 horas despus de Warehouse manager sexo y debe retirarse en el plazo de las 24 horas. Un diafragma es recetado y colocado por un mdico. Debe reemplazarse cada 1 a 2 aos, despus de dar a luz, de aumentar ms de 15lb (6.8kg) y de [place] plvica. Capuchn cervical Un capuchn cervical es una copa redonda y blanda de ltex o plstico que se coloca en el cuello uterino. Se inserta en la vagina antes del sexo, junto con un espermicida. Bloquea el ingreso del esperma en el tero. El capuchn Radio producer durante 6 a 8 horas despus de Warehouse manager sexo y debe retirarse en el plazo de las 48 horas. Un capuchn cervical debe ser recetado y colocado por un mdico. Debe reemplazarse cada 2aos. Esponja Una esponja es una pieza blanda y circular de espuma de poliuretano que contiene espermicida. La esponja ayuda a bloquear el ingreso de esperma en el tero, y el espermicida mata a los espermatozoides. Belva Bertin, debe humedecerla e insertarla en la vagina. Debe insertarse antes de eBay, debe permanecer dentro al menos durante 6 horas despus de [place] sexo y debe retirarse y Nurse, adult en  el plazo de las 30 horas. Espermicidas Los espermicidas son sustancias qumicas que matan o bloquean al esperma y no lo dejan ingresar al cuello uterino y al tero. Vienen en forma de crema, gel,  supositorio, espuma o comprimido. Un espermicida debe insertarse en la vagina con un aplicador al menos 10 o 15 minutos antes de tener sexo para dar tiempo a que surta efecto. El proceso debe repetirse cada vez que tenga sexo. Los espermicidas no requieren receta mdica. Anticonceptivos intrauterinos Dispositivo intrauterino (DIU). Un DIU un dispositivo en forma de T que se coloca en el tero. Hay dos tipos:  DIU hormonal. Este tipo contiene progestina, una forma sinttica de la hormona progesterona. Este tipo puede permanecer colocado durante 3 a 5 aos.  DIU de cobre. Este tipo est recubierto con un alambre de cobre. Puede permanecer colocado durante 10 aos.  Mtodos anticonceptivos permanentes Ligadura de trompas en la mujer En este mtodo, se sellan, atan u obstruyen las trompas de Falopio durante una ciruga para evitar que el vulo descienda hacia el tero. Esterilizacin histeroscpica En este mtodo, se coloca un implante pequeo y flexible dentro de cada trompa de Falopio. Los implantes hacen que se forme un tejido cicatricial en las trompas de Falopio y que las obstruya para que el espermatozoide no pueda llegar al vulo. El procedimiento demora alrededor de 3 meses para que sea efectivo. Debe utilizarse otro mtodo anticonceptivo durante esos 3 meses. Esterilizacin masculina Este es un procedimiento que consiste en atar los conductos que transportan el esperma (vasectoma). Luego del procedimiento, el hombre puede eyacular lquido (semen). Mtodos de planificacin natural Planificacin familiar natural En este mtodo, la pareja no tiene sexo durante los das en que la mujer podra quedar embarazada. Mtodo calendario Esto significa realizar un seguimiento de la duracin de cada ciclo menstrual, identificar los das en los que se puede producir un embarazo y no tener sexo durante esos das. Mtodo de la ovulacin En este mtodo, la pareja evita tener sexo durante la  ovulacin. Mtodo sintotrmico Este mtodo implica no tener sexo durante la ovulacin. Normalmente, la mujer comprueba la ovulacin al observar cambios en su temperatura y en la consistencia del moco cervical. Mtodo posovulacin En este mtodo, la pareja espera a que finalice la ovulacin para tener sexo. Resumen  La anticoncepcin, o los mtodos anticonceptivos, hace referencia a los mtodos o dispositivos que evitan el embarazo.  Los mtodos anticonceptivos hormonales incluyen implantes, inyecciones, pastillas, parches, anillos vaginales y anticonceptivos de emergencia.  Los mtodos anticonceptivos de barrera pueden incluir preservativos masculinos, preservativos femeninos, diafragmas, capuchones cervicales, esponjas y espermicidas.  Hay dos tipos de DIU (dispositivos intrauterinos). Un DIU puede colocarse en el tero de una mujer para evitar el embarazo durante 3 a 5 aos.  La esterilizacin permanente puede realizarse mediante un procedimiento para hombres, mujeres o ambos.  Los mtodos de planificacin familiar natural incluyen no tener sexo durante los das en que la mujer podra quedar embarazada. Esta informacin no tiene como fin reemplazar el consejo del mdico. Asegrese de hacerle al mdico cualquier pregunta que tenga. Document Released: 09/30/2005 Document Revised: 01/20/2017 Document Reviewed: 01/20/2017 Elsevier Interactive Patient Education  2018 Elsevier Inc.  

## 2018-11-02 ENCOUNTER — Ambulatory Visit: Payer: Self-pay | Attending: Family Medicine

## 2018-12-03 ENCOUNTER — Other Ambulatory Visit: Payer: Self-pay

## 2018-12-03 ENCOUNTER — Encounter (INDEPENDENT_AMBULATORY_CARE_PROVIDER_SITE_OTHER): Payer: Self-pay | Admitting: Primary Care

## 2018-12-03 ENCOUNTER — Ambulatory Visit (INDEPENDENT_AMBULATORY_CARE_PROVIDER_SITE_OTHER): Payer: Self-pay | Admitting: Primary Care

## 2018-12-03 VITALS — BP 131/78 | HR 77 | Temp 98.2°F | Ht 62.0 in | Wt 162.0 lb

## 2018-12-03 DIAGNOSIS — E7841 Elevated Lipoprotein(a): Secondary | ICD-10-CM

## 2018-12-03 DIAGNOSIS — R5383 Other fatigue: Secondary | ICD-10-CM | POA: Insufficient documentation

## 2018-12-03 DIAGNOSIS — E119 Type 2 diabetes mellitus without complications: Secondary | ICD-10-CM | POA: Insufficient documentation

## 2018-12-03 DIAGNOSIS — L659 Nonscarring hair loss, unspecified: Secondary | ICD-10-CM

## 2018-12-03 HISTORY — DX: Elevated lipoprotein(a): E78.41

## 2018-12-03 HISTORY — DX: Nonscarring hair loss, unspecified: L65.9

## 2018-12-03 LAB — GLUCOSE, POCT (MANUAL RESULT ENTRY): POC Glucose: 149 mg/dl — AB (ref 70–99)

## 2018-12-03 MED ORDER — METFORMIN HCL ER 500 MG PO TB24: 500.0000 mg | ORAL_TABLET | Freq: Every day | ORAL | 5 refills | Status: DC

## 2018-12-03 NOTE — Patient Instructions (Signed)
Plan de alimentación para personas con prediabetes  Prediabetes Eating Plan  La prediabetes es una afección que hace que los niveles de azúcar en la sangre (glucosa) sean más altos de lo normal. Esto aumenta el riesgo de tener diabetes. Para prevenir la diabetes, es posible que su médico le recomiende cambios en la dieta y otros cambios en su estilo de vida que lo ayuden a lograr lo siguiente:  · Controlar los niveles de glucemia.  · Mejorar los niveles de colesterol.  · Controlar la presión arterial.  El médico puede recomendarle que trabaje con un especialista en alimentación y nutrición (nutricionista) para elaborar el plan de comidas más conveniente para usted.  Consejos para seguir este plan:  Estilo de vida  · Establezca metas para bajar de peso con la ayuda de su equipo de atención médica. A la mayoría de las personas con prediabetes se les recomienda bajar un 7 % de su peso corporal.  · Haga ejercicio al menos 30 minutos 5 días por semana, como mínimo.  · Asista a un grupo de apoyo o solicite el apoyo continuo de un consejero de salud mental.  · Tome los medicamentos de venta libre y los recetados solamente como se lo haya indicado el médico.  Leer las etiquetas de los alimentos  · Lea las etiquetas de los alimentos envasados para controlar la cantidad de grasa, sal (sodio) y azúcar que contienen. Evite los alimentos que contengan lo siguiente:  ? Grasas saturadas.  ? Grasas trans.  ? Azúcares agregados.  · Evite los alimentos que contengan más de 300 miligramos (mg) de sodio por porción. Limite el consumo diario de sodio a menos de 2300 mg por día.  De compras  · Evite comprar alimentos procesados y preelaborados.  Cocción  · Cocine con aceite de oliva. No use mantequilla, manteca de cerdo o mantequilla clarificada.  · Cocine los alimentos al horno, a la parrilla, asados o hervidos. Evite freírlos.  Planificación de las comidas    · Trabaje con el nutricionista para crear un plan de alimentación que sea  adecuado para usted. Esto puede incluir lo siguiente:  ? Registro de la cantidad de calorías que ingiere. Use un registro de alimentos, un cuaderno o una aplicación móvil para anotar lo que comió en cada comida.  ? Uso del índice glucémico (IG) para planificar las comidas. El índice muestra con qué rapidez elevará la glucemia un alimento. Elija alimentos con bajo IG. Estos demoran más en elevar la glucemia.  · Considere la posibilidad de seguir una dieta mediterránea. Esta dieta incluye lo siguiente:  ? Varias porciones de frutas y verduras frescas por día.  ? Pescado al menos dos veces por semana.  ? Varias porciones de cereales integrales, frijoles, frutos secos y semillas por día.  ? Aceite de oliva en lugar de otras grasas.  ? Consumo moderado de alcohol.  ? Pequeñas cantidades de carnes rojas y lácteos enteros.  · Si tiene hipertensión arterial, quizás deba limitar el consumo de sodio o seguir una dieta como el plan de alimentación basado en Enfoques Alimentarios para Detener la Hipertensión (Dietary Approaches to Stop Hypertension, DASH). Este es un plan de alimentación cuyo objetivo es bajar la hipertensión arterial.  ¿Qué alimentos se recomiendan?  Es posible que los alimentos incluidos a continuación no constituyan la lista completa. Hable con el nutricionista sobre las mejores opciones alimenticias para usted.  Cereales  Productos integrales, como panes, galletas, cereales y pastas de salvado o integrales. Avena sin azúcar. Trigo burgol. Cebada. Quinua. Arroz integral. Tacos   o tortillas de harina de maíz o de salvado.  Verduras  Lechuga. Espinaca. Guisantes. Remolachas. Coliflor. Repollo. Brócoli. Zanahorias. Tomates. Calabaza. Berenjena. Hierbas. Pimienta. Cebollas. Pepinos. Repollitos de Bruselas.  Frutas  Frutos rojos. Bananas. Manzanas. Naranjas. Uvas. Papaya. Mango. Granada. Kiwi. Pomelo. Cerezas.  Carnes y otros alimentos ricos en proteínas  Mariscos. Carne de ave sin piel. Cortes magros de cerdo y  carne de res. Tofu. Huevos. Frutos secos. Frijoles.  Lácteos  Productos lácteos descremados o semidescremados, como yogur, queso cottage y queso.  Bebidas  Agua. Té. Café. Gaseosas sin azúcar o dietéticas. Soda. Leche descremada o semidescremada. Productos alternativos a la leche, como leche de soja o de almendras.  Grasas y aceites  Aceite de oliva. Aceite de canola. Aceite de girasol. Aceite de semillas de uva. [place]. Nueces.  Dulces y postres  Pudin sin azúcar o con bajo contenido de grasa. Helado y otros postres congelados sin azúcar o con bajo contenido de grasa.  Condimentos y otros alimentos  Hierbas. Especias sin sodio. Mostaza. Salsa de pepinillos. Kétchup con bajo contenido de grasa y de azúcar. Salsa barbacoa con bajo contenido de grasa y de azúcar. Mayonesa con bajo contenido de grasa o sin grasa.  ¿Qué alimentos no se recomiendan?  Es posible que los alimentos incluidos a continuación no constituyan la lista completa. Hable con el nutricionista sobre las mejores opciones alimenticias para usted.  Cereales  Productos elaborados con harina y harina blanca refinada, como panes, pastas, bocadillos y cereales.  Verduras  Verduras enlatadas. Verduras congeladas con mantequilla o salsa de crema.  Frutas  Frutas enlatadas al almíbar.  Carnes y otros alimentos ricos en proteínas  Cortes de carne con grasa. Carne de ave con piel. Carne empanizada o frita. Carnes procesadas.  Lácteos  Yogur, queso o leche enteros.  Bebidas  Bebidas azucaradas, como té helado dulce y gaseosas.  Grasas y aceites  Mantequilla. Manteca de cerdo. Mantequilla clarificada.  Dulces y postres  Productos horneados, como pasteles, pastelitos, galletas dulces y tarta de queso.  Condimentos y otros alimentos  Mezclas de especias con sal agregada. Kétchup. Salsa barbacoa. Mayonesa.  Resumen  · Para prevenir la diabetes, es posible que deba hacer cambios en la dieta y otros cambios en su estilo de vida para ayudar a controlar el azúcar en la  sangre, mejorar los niveles de colesterol y controlar la presión arterial.  · Establezca metas para bajar de peso con la ayuda de su equipo de atención médica. A la mayoría de las personas con prediabetes se les recomienda bajar un 7 por ciento de su peso corporal.  · Considere la posibilidad de seguir una dieta mediterránea que incluya muchas frutas y verduras frescas, cereales integrales, frijoles, frutos secos, semillas, pescado, carnes magras, lácteos descremados y aceites saludables.  Esta información no tiene como fin reemplazar el consejo del médico. Asegúrese de hacerle al médico cualquier pregunta que tenga.  Document Released: 06/21/2015 Document Revised: 04/14/2017 Document Reviewed: 04/14/2017  Elsevier Interactive Patient Education © 2019 Elsevier Inc.

## 2018-12-03 NOTE — Progress Notes (Signed)
Established Patient Office Visit  Subjective:  Patient ID: Lindsay Hunt, female    DOB: December 11, 1985  Age: 33 y.o. MRN: [id number]  CC:  Chief Complaint  Patient presents with  . Follow-up    DM    HPI Lindsay Hunt presents for follow up on i T2D, her last A1C have ranged from % 5-5-5.7 . Reviewed all avaiable A1C and in 5/18 her A1C was 11.3. She has a PMH of 2 spontaneous abortions,  obesity n hyperlipidemia  and coagulopathies.  Past Medical History:  Diagnosis Date  . Diabetes mellitus without complication ([place])   . Hyperlipidemia   . Hypertension     Past Surgical History:  Procedure Laterality Date  . NO PAST SURGERIES      Family History  Problem Relation Age of Onset  . Diabetes Mother   . Cancer Mother   . Diabetes Father   . Diabetes Sister   . Diabetes Brother   . Diabetes Maternal Grandmother   . Cancer Maternal Grandmother   . Diabetes Maternal Grandfather   . Diabetes Paternal Grandmother   . Diabetes Paternal Grandfather     Social History   Socioeconomic History  . Marital status: Married    Spouse name: Not on file  . Number of children: Not on file  . Years of education: Not on file  . Highest education level: Not on file  Occupational History  . Not on file  Social Needs  . Financial resource strain: Not on file  . Food insecurity:    Worry: Not on file    Inability: Not on file  . Transportation needs:    Medical: Not on file    Non-medical: Not on file  Tobacco Use  . Smoking status: Never Smoker  . Smokeless tobacco: Never Used  Substance and Sexual Activity  . Alcohol use: No  . Drug use: Never  . Sexual activity: Not Currently    Birth control/protection: None  Lifestyle  . Physical activity:    Days per week: Not on file    Minutes per session: Not on file  . Stress: Not on file  Relationships  . Social connections:    Talks on phone: Not on file    Gets together: Not on file    Attends religious  service: Not on file    Active member of club or organization: Not on file    Attends meetings of clubs or organizations: Not on file    Relationship status: Not on file  . Intimate partner violence:    Fear of current or ex partner: Not on file    Emotionally abused: Not on file    Physically abused: Not on file    Forced sexual activity: Not on file  Other Topics Concern  . Not on file  Social History Narrative  . Not on file    Outpatient Medications Prior to Visit  Medication Sig Dispense Refill  . blood glucose meter kit and supplies KIT Dispense based on patient and insurance preference. Use up to four times daily as directed. (FOR ICD-9 250.00, 250.01). 1 each 0  . metFORMIN (GLUCOPHAGE-XR) 500 MG 24 hr tablet Take 1 tablet (500 mg total) by mouth daily with breakfast. 30 tablet 5  . cholecalciferol (VITAMIN D) 1000 units tablet Take 1 tablet (1,000 Units total) by mouth daily. (Patient not taking: Reported on 08/20/2018) 30 tablet 5  . misoprostol (CYTOTEC) 200 MCG tablet 2 tablets oral and 2 per  vagina (Patient not taking: Reported on 08/26/2018) 4 tablet 0  . pravastatin (PRAVACHOL) 40 MG tablet Take 1 tablet (40 mg total) by mouth daily. (Patient not taking: Reported on 08/18/2018) 30 tablet 5   No facility-administered medications prior to visit.     No Known Allergies  ROS Review of Systems  Constitutional: Positive for fatigue.  HENT: Negative.   Eyes: Negative.   Respiratory: Negative.   Cardiovascular: Negative.   Gastrointestinal: Negative.   Endocrine: Negative.   Genitourinary: Positive for frequency.  Musculoskeletal: Negative.   Skin: Negative.   Allergic/Immunologic: Negative.   Neurological: Negative.   Hematological: Negative.   Psychiatric/Behavioral: Negative.       Objective:     BP 131/78 (BP Location: Right Arm, Patient Position: Sitting, Cuff Size: Normal)   Pulse 77   Temp 98.2 F (36.8 C) (Oral)   Ht '5\' 2"'$  (1.575 m)   Wt 162 lb  (73.5 kg)   LMP 11/30/2018 (Exact Date)   SpO2 97%   Breastfeeding No   BMI 29.63 kg/m  Wt Readings from Last 3 Encounters:  12/03/18 162 lb (73.5 kg)  08/31/18 156 lb 1.6 oz (70.8 kg)  08/26/18 155 lb 12.8 oz (70.7 kg)   Physical Exam  Constitutional: She is oriented to person, place, and time.  Well developed, well nourished, NAD  HENT:  Head: Normocephalic and atraumatic.  Eyes: No scleral icterus.  Neck: Normal range of motion. Neck supple. No thyromegaly present.  Cardiovascular: Normal rate, regular rhythm and normal heart sounds.  Pulmonary/Chest: Effort normal and breath sounds normal.  Musculoskeletal: She exhibits no edema.  Neurological: She is alert and oriented to person, place, and time.  Skin: Skin is warm and dry. No rash noted. No erythema. No pallor.  Psychiatric: Her behavior is normal. Thought content normal.  Vitals reviewed.  Health Maintenance Due  Topic Date Due  . OPHTHALMOLOGY EXAM  12/15/1993  . HIV Screening  12/16/1998  . FOOT EXAM  12/01/2018  . HEMOGLOBIN A1C  12/03/2018    There are no preventive care reminders to display for this patient.  Lab Results  Component Value Date   TSH 2.920 12/01/2017   Lab Results  Component Value Date   WBC 6.4 02/03/2017   HGB 13.4 08/13/2018   HCT 37 08/13/2018   MCV 89.6 02/03/2017   PLT 242 08/13/2018   Lab Results  Component Value Date   NA 140 06/02/2018   K 4.1 06/02/2018   CO2 22 06/02/2018   GLUCOSE 116 (H) 06/02/2018   BUN 12 06/02/2018   CREATININE 0.58 06/02/2018   BILITOT 0.3 02/20/2017   ALKPHOS 104 02/20/2017   AST 28 02/20/2017   ALT 38 (H) 02/20/2017   PROT 7.3 02/20/2017   ALBUMIN 4.4 02/20/2017   CALCIUM 9.3 06/02/2018   ANIONGAP 13 02/03/2017   Lab Results  Component Value Date   CHOL 189 12/01/2017   Lab Results  Component Value Date   HDL 35 (L) 12/01/2017   Lab Results  Component Value Date   LDLCALC 107 (H) 12/01/2017   Lab Results  Component Value Date    TRIG 234 (H) 12/01/2017   Lab Results  Component Value Date   CHOLHDL 5.4 (H) 12/01/2017   Lab Results  Component Value Date   HGBA1C 5.5 06/02/2018      Assessment & Plan:   Problem List Items Addressed This Visit    Hair loss   Relevant Orders   TSH   T4,  free   Fatigue   Relevant Orders   TSH   T4, free   Elevated lipoprotein(a)   Type 2 diabetes mellitus without complication, without long-term current use of insulin (HCC) - Primary   Relevant Medications   metFORMIN (GLUCOPHAGE-XR) 500 MG 24 hr tablet   Other Relevant Orders   Glucose (CBG) (Completed)      Meds ordered this encounter  Medications  . metFORMIN (GLUCOPHAGE-XR) 500 MG 24 hr tablet    Sig: Take 1 tablet (500 mg total) by mouth daily with breakfast.    Dispense:  30 tablet    Refill:  5    Follow-up: Return in about 6 months (around 06/03/2019) for A1C follow up on diabetes .    Kerin Perna, NP

## 2018-12-04 LAB — T4, FREE: Free T4: 1.17 ng/dL (ref 0.82–1.77)

## 2018-12-04 LAB — TSH: TSH: 3.27 u[IU]/mL (ref 0.450–4.500)

## 2018-12-15 ENCOUNTER — Encounter (INDEPENDENT_AMBULATORY_CARE_PROVIDER_SITE_OTHER): Payer: Self-pay | Admitting: Primary Care

## 2018-12-15 ENCOUNTER — Other Ambulatory Visit: Payer: Self-pay

## 2018-12-15 ENCOUNTER — Ambulatory Visit (INDEPENDENT_AMBULATORY_CARE_PROVIDER_SITE_OTHER): Payer: Self-pay | Admitting: Primary Care

## 2018-12-15 VITALS — BP 116/76 | HR 68 | Temp 98.2°F | Ht 62.0 in | Wt 162.4 lb

## 2018-12-15 DIAGNOSIS — K047 Periapical abscess without sinus: Secondary | ICD-10-CM

## 2018-12-15 DIAGNOSIS — E119 Type 2 diabetes mellitus without complications: Secondary | ICD-10-CM

## 2018-12-15 DIAGNOSIS — K029 Dental caries, unspecified: Secondary | ICD-10-CM

## 2018-12-15 DIAGNOSIS — E782 Mixed hyperlipidemia: Secondary | ICD-10-CM

## 2018-12-15 NOTE — Progress Notes (Signed)
Established Patient Office Visit  Subjective:  Patient ID: Lindsay Hunt, female    DOB: [birth year]  Age: 33 y.o. MRN: [id number]  CC:  Chief Complaint  Patient presents with  . Referral    dentist  . Results    for thyorid     HPI Lindsay Hunt presents for labs results and dental referral for a tooth on back bottom sensitive to temperature changes. She is requesting FLP. PMH of hyperlipidemia, failed pregnancy ,   Past Medical History:  Diagnosis Date  . Diabetes mellitus without complication ([place])   . Hyperlipidemia   . Hypertension     Past Surgical History:  Procedure Laterality Date  . NO PAST SURGERIES      Family History  Problem Relation Age of Onset  . Diabetes Mother   . Cancer Mother   . Diabetes Father   . Diabetes Sister   . Diabetes Brother   . Diabetes Maternal Grandmother   . Cancer Maternal Grandmother   . Diabetes Maternal Grandfather   . Diabetes Paternal Grandmother   . Diabetes Paternal Grandfather     Social History   Socioeconomic History  . Marital status: Married    Spouse name: Not on file  . Number of children: Not on file  . Years of education: Not on file  . Highest education level: Not on file  Occupational History  . Not on file  Social Needs  . Financial resource strain: Not on file  . Food insecurity:    Worry: Not on file    Inability: Not on file  . Transportation needs:    Medical: Not on file    Non-medical: Not on file  Tobacco Use  . Smoking status: Never Smoker  . Smokeless tobacco: Never Used  Substance and Sexual Activity  . Alcohol use: No  . Drug use: Never  . Sexual activity: Not Currently    Birth control/protection: None  Lifestyle  . Physical activity:    Days per week: Not on file    Minutes per session: Not on file  . Stress: Not on file  Relationships  . Social connections:    Talks on phone: Not on file    Gets together: Not on file    Attends religious service: Not on  file    Active member of club or organization: Not on file    Attends meetings of clubs or organizations: Not on file    Relationship status: Not on file  . Intimate partner violence:    Fear of current or ex partner: Not on file    Emotionally abused: Not on file    Physically abused: Not on file    Forced sexual activity: Not on file  Other Topics Concern  . Not on file  Social History Narrative  . Not on file    Outpatient Medications Prior to Visit  Medication Sig Dispense Refill  . blood glucose meter kit and supplies KIT Dispense based on patient and insurance preference. Use up to four times daily as directed. (FOR ICD-9 250.00, 250.01). 1 each 0  . metFORMIN (GLUCOPHAGE-XR) 500 MG 24 hr tablet Take 1 tablet (500 mg total) by mouth daily with breakfast. 30 tablet 5  . cholecalciferol (VITAMIN D) 1000 units tablet Take 1 tablet (1,000 Units total) by mouth daily. (Patient not taking: Reported on 08/20/2018) 30 tablet 5  . pravastatin (PRAVACHOL) 40 MG tablet Take 1 tablet (40 mg total) by mouth daily. (Patient not  taking: Reported on 08/18/2018) 30 tablet 5  . misoprostol (CYTOTEC) 200 MCG tablet 2 tablets oral and 2 per vagina (Patient not taking: Reported on 08/26/2018) 4 tablet 0   No facility-administered medications prior to visit.     No Known Allergies  ROS Review of Systems  Constitutional: Negative.   HENT: Positive for dental problem.   Eyes: Negative.   Respiratory: Negative.   Cardiovascular: Negative.   Gastrointestinal: Negative.   Endocrine: Negative.   Genitourinary: Negative.   Musculoskeletal: Negative.   Skin: Negative.   Allergic/Immunologic: Negative.   Neurological: Negative.   Hematological: Negative.   Psychiatric/Behavioral: Negative.       Objective:    Physical Exam  Constitutional: She is oriented to person, place, and time. She appears well-developed.  HENT:  Head: Normocephalic.  Neck: Normal range of motion.  Cardiovascular:  Normal rate and regular rhythm.  Pulmonary/Chest: Effort normal and breath sounds normal.  Abdominal: Soft. Bowel sounds are normal.  Musculoskeletal: Normal range of motion.  Neurological: She is alert and oriented to person, place, and time.  Skin: Skin is warm.  Psychiatric: She has a normal mood and affect.    BP 116/76 (BP Location: Right Arm, Patient Position: Sitting, Cuff Size: Normal)   Pulse 68   Temp 98.2 F (36.8 C) (Oral)   Ht '5\' 2"'$  (1.575 m)   Wt 162 lb 6.4 oz (73.7 kg)   LMP 11/30/2018 (Exact Date)   SpO2 96%   BMI 29.70 kg/m  Wt Readings from Last 3 Encounters:  12/15/18 162 lb 6.4 oz (73.7 kg)  12/03/18 162 lb (73.5 kg)  08/31/18 156 lb 1.6 oz (70.8 kg)     Health Maintenance Due  Topic Date Due  . OPHTHALMOLOGY EXAM  12/15/1993  . HIV Screening  12/16/1998  . FOOT EXAM  12/01/2018  . HEMOGLOBIN A1C  12/03/2018    There are no preventive care reminders to display for this patient.  Lab Results  Component Value Date   TSH 3.270 12/03/2018   Lab Results  Component Value Date   WBC 6.4 02/03/2017   HGB 13.4 08/13/2018   HCT 37 08/13/2018   MCV 89.6 02/03/2017   PLT 242 08/13/2018   Lab Results  Component Value Date   NA 140 06/02/2018   K 4.1 06/02/2018   CO2 22 06/02/2018   GLUCOSE 116 (H) 06/02/2018   BUN 12 06/02/2018   CREATININE 0.58 06/02/2018   BILITOT 0.3 02/20/2017   ALKPHOS 104 02/20/2017   AST 28 02/20/2017   ALT 38 (H) 02/20/2017   PROT 7.3 02/20/2017   ALBUMIN 4.4 02/20/2017   CALCIUM 9.3 06/02/2018   ANIONGAP 13 02/03/2017   Lab Results  Component Value Date   CHOL 189 12/01/2017   Lab Results  Component Value Date   HDL 35 (L) 12/01/2017   Lab Results  Component Value Date   LDLCALC 107 (H) 12/01/2017   Lab Results  Component Value Date   TRIG 234 (H) 12/01/2017   Lab Results  Component Value Date   CHOLHDL 5.4 (H) 12/01/2017   Lab Results  Component Value Date   HGBA1C 5.5 06/02/2018       Assessment & Plan:   Problem List Items Addressed This Visit    Type 2 diabetes mellitus without complication, without long-term current use of insulin ([place]) - Primary    Other Visit Diagnoses    Infected dental carries       Relevant Orders   Ambulatory  referral to Dentistry   Mixed hyperlipidemia       Relevant Orders   Lipid Panel     1. Type 2 diabetes mellitus without complication, without long-term current use of insulin ([facility]) On oral agents  Refilled metformin  2. Infected dental carries  - Ambulatory referral to Dentistry  3. Mixed hyperlipidemia - Lipid Panel  Follow-up: Return in about 6 months (around 06/17/2019) for Pre diabetes and hyperlipidemia .    Kerin Perna, NP

## 2018-12-15 NOTE — Progress Notes (Signed)
Dental pain for three weeks in back molar. Lindsay Hunt, CMA

## 2018-12-16 LAB — LIPID PANEL
Chol/HDL Ratio: 6.1 ratio — ABNORMAL HIGH (ref 0.0–4.4)
Cholesterol, Total: 207 mg/dL — ABNORMAL HIGH (ref 100–199)
HDL: 34 mg/dL — ABNORMAL LOW (ref >39)
LDL CALC: 118 mg/dL — AB (ref 0–99)
Triglycerides: 273 mg/dL — ABNORMAL HIGH (ref 0–149)
VLDL Cholesterol Cal: 55 mg/dL — ABNORMAL HIGH (ref 5–40)

## 2018-12-18 ENCOUNTER — Telehealth (INDEPENDENT_AMBULATORY_CARE_PROVIDER_SITE_OTHER): Payer: Self-pay

## 2018-12-18 ENCOUNTER — Other Ambulatory Visit (INDEPENDENT_AMBULATORY_CARE_PROVIDER_SITE_OTHER): Payer: Self-pay | Admitting: Primary Care

## 2018-12-18 DIAGNOSIS — E782 Mixed hyperlipidemia: Secondary | ICD-10-CM

## 2018-12-18 MED ORDER — PRAVASTATIN SODIUM 40 MG PO TABS: 40.0000 mg | ORAL_TABLET | Freq: Every day | ORAL | 5 refills | Status: DC

## 2018-12-18 NOTE — Telephone Encounter (Signed)
Call placed using pacific interpreter Lonna Cobb [phone]) patient is aware that cholesterol. She is aware that elevated cholesterol places her at increased risk for stroke and heart attack. Cholesterol medication has been sent to pharmacy, take daily. TSH normal. Patient expressed understanding. Maryjean Morn, CMA

## 2018-12-18 NOTE — Telephone Encounter (Signed)
-----   Message from Grayce Sessions, NP sent at 12/18/2018  8:27 AM EST ----- Patient has elevated chol she is at risk for a cardiac event heart attack/stroke will send in chol medication needs to take Qhs TSH wnl

## 2019-06-03 ENCOUNTER — Ambulatory Visit (INDEPENDENT_AMBULATORY_CARE_PROVIDER_SITE_OTHER): Payer: Self-pay | Admitting: Primary Care

## 2019-06-17 ENCOUNTER — Ambulatory Visit (INDEPENDENT_AMBULATORY_CARE_PROVIDER_SITE_OTHER): Payer: Self-pay | Admitting: Primary Care

## 2019-07-12 ENCOUNTER — Other Ambulatory Visit: Payer: Self-pay

## 2019-07-12 ENCOUNTER — Ambulatory Visit: Payer: Self-pay | Attending: Family Medicine

## 2019-07-28 ENCOUNTER — Encounter (INDEPENDENT_AMBULATORY_CARE_PROVIDER_SITE_OTHER): Payer: Self-pay | Admitting: Primary Care

## 2019-07-28 ENCOUNTER — Other Ambulatory Visit: Payer: Self-pay

## 2019-07-28 ENCOUNTER — Ambulatory Visit (INDEPENDENT_AMBULATORY_CARE_PROVIDER_SITE_OTHER): Payer: Self-pay | Admitting: Primary Care

## 2019-07-28 VITALS — BP 113/74 | HR 70 | Temp 97.5°F | Ht 62.0 in | Wt 155.8 lb

## 2019-07-28 DIAGNOSIS — N920 Excessive and frequent menstruation with regular cycle: Secondary | ICD-10-CM

## 2019-07-28 DIAGNOSIS — Z23 Encounter for immunization: Secondary | ICD-10-CM

## 2019-07-28 DIAGNOSIS — E119 Type 2 diabetes mellitus without complications: Secondary | ICD-10-CM

## 2019-07-28 DIAGNOSIS — Z114 Encounter for screening for human immunodeficiency virus [HIV]: Secondary | ICD-10-CM

## 2019-07-28 DIAGNOSIS — E7841 Elevated Lipoprotein(a): Secondary | ICD-10-CM

## 2019-07-28 DIAGNOSIS — N3942 Incontinence without sensory awareness: Secondary | ICD-10-CM

## 2019-07-28 DIAGNOSIS — R7303 Prediabetes: Secondary | ICD-10-CM

## 2019-07-28 LAB — POCT GLYCOSYLATED HEMOGLOBIN (HGB A1C): Hemoglobin A1C: 5.8 % — AB (ref 4.0–5.6)

## 2019-07-28 MED ORDER — IBUPROFEN 600 MG PO TABS: 600.0000 mg | ORAL_TABLET | Freq: Three times a day (TID) | ORAL | 3 refills | Status: DC | PRN

## 2019-07-28 NOTE — Patient Instructions (Addendum)
Menorragia Menorrhagia Se llama menorragia cuando sus perodos menstruales son muy abundantes o duran ms de lo normal. Siga estas indicaciones en su casa: Medicamentos   [employer] de venta libre y los recetados exactamente como se lo haya indicado el mdico. Esto incluye pldoras de suplemento de hierro.  No cambie ni reemplace los medicamentos sin consultarlo con su mdico.  No tome aspirina ni medicamentos que contengan aspirina a partir de 1semana antes ni durante su perodo. La aspirina puede hacer que la hemorragia empeore. Instrucciones generales  Si necesita cambiar el apsito o el tampn ms de una vez cada 2horas, limite su actividad hasta que el sangrado se [place].  Las pldoras de hierro pueden causar problemas para Landscape architect (estreimiento). Para prevenir o tratar el estreimiento mientras toma pldoras de hierro recetadas, su mdico puede sugerirle lo siguiente: ? Beber suficiente lquido como para mantener el pis (orina) claro o de color amarillo plido. ? Tomar medicamentos recetados o de [employer]. ? Comer alimentos ricos en fibra. Estos incluyen los siguientes: ? Lambert Mody y verduras frescas. ? Cereales integrales. ? Frijoles. ? Limitar el consumo de alimentos ricos en grasa y azcares procesados. Estos incluyen los alimentos fritos y dulces.  Comer comidas saludables y alimentos ricos en hierro. Los alimentos que tienen mucho hierro [employer] siguientes: ? Verduras de [employer]. ? Carne. ? Hgado. ? Huevos. ? Panes y cereales integrales.  No intente adelgazar hasta que el sangrado abundante se haya detenido y tenga cantidades normales de hierro en la sangre. Si necesita adelgazar, consulte a su mdico.  Concurra a todas las visitas de control como se lo haya indicado el mdico. Esto es importante. Comunquese con un mdico si:  Empapa un tampn o un apsito cada 1 o 2 horas, y [employer] ocurre cada vez que tiene el perodo.  Necesita usar  apsitos y tampones al mismo tiempo porque pierde [employer].  Si est tomando medicamentos y tiene lo siguiente: ? Higher education careers adviser (nuseas). ? Vmitos. ? Materia fecal es lquida (diarrea).  Algn problema que pueda relacionarse con el medicamento que est tomando. Solicite ayuda de inmediato si:  Empapa ms de un apsito o un tampn en 1 hora.  Elimina cogulos ms grandes que 1 pulgada (2,5 cm).  Le falta el aire.  Siente que el corazn late [place] rpido.  Se siente mareado o se desvanece (se desmaya).  Se siente muy dbil o cansada. Resumen  Se llama menorragia cuando sus perodos menstruales son muy abundantes o duran ms de lo normal.  [employer] de venta libre y los recetados exactamente como se lo haya indicado el mdico. Esto incluye pldoras de suplemento de hierro.  Comunquese con un mdico si empapa ms de un apsito o un tampn en 1hora, o si elimina cogulos grandes. Esta informacin no tiene Marine scientist el consejo del mdico. Asegrese de hacerle al mdico cualquier pregunta que tenga. Document Released: 11/02/2010 Document Revised: 06/26/2017 Document Reviewed: 04/01/2013 Elsevier Patient Education  2020 [employer].

## 2019-07-28 NOTE — Progress Notes (Addendum)
Established Patient Office Visit  Subjective:  Patient ID: Lindsay Hunt, female    DOB: May 17, 1986  Age: 33 y.o. MRN: [id number]  CC:  Chief Complaint  Patient presents with  . Follow-up    prediabetes/hyperlipidemia    HPI Lindsay Hunt presents for management of prediabetes and hyperlipidemia. She voices concerns about fungus on her toes- .Exam feet.  Past Medical History:  Diagnosis Date  . Diabetes mellitus without complication ([place])   . Hyperlipidemia   . Hypertension     Past Surgical History:  Procedure Laterality Date  . NO PAST SURGERIES      Family History  Problem Relation Age of Onset  . Diabetes Mother   . Cancer Mother   . Diabetes Father   . Diabetes Sister   . Diabetes Brother   . Diabetes Maternal Grandmother   . Cancer Maternal Grandmother   . Diabetes Maternal Grandfather   . Diabetes Paternal Grandmother   . Diabetes Paternal Grandfather     Social History   Socioeconomic History  . Marital status: Married    Spouse name: Not on file  . Number of children: Not on file  . Years of education: Not on file  . Highest education level: Not on file  Occupational History  . Not on file  Social Needs  . Financial resource strain: Not on file  . Food insecurity    Worry: Not on file    Inability: Not on file  . Transportation needs    Medical: Not on file    Non-medical: Not on file  Tobacco Use  . Smoking status: Never Smoker  . Smokeless tobacco: Never Used  Substance and Sexual Activity  . Alcohol use: No  . Drug use: Never  . Sexual activity: Not Currently    Birth control/protection: None  Lifestyle  . Physical activity    Days per week: Not on file    Minutes per session: Not on file  . Stress: Not on file  Relationships  . Social Herbalist on phone: Not on file    Gets together: Not on file    Attends religious service: Not on file    Active member of club or organization: Not on file    Attends  meetings of clubs or organizations: Not on file    Relationship status: Not on file  . Intimate partner violence    Fear of current or ex partner: Not on file    Emotionally abused: Not on file    Physically abused: Not on file    Forced sexual activity: Not on file  Other Topics Concern  . Not on file  Social History Narrative  . Not on file    Outpatient Medications Prior to Visit  Medication Sig Dispense Refill  . metFORMIN (GLUCOPHAGE-XR) 500 MG 24 hr tablet Take 1 tablet (500 mg total) by mouth daily with breakfast. 30 tablet 5  . blood glucose meter kit and supplies KIT Dispense based on patient and insurance preference. Use up to four times daily as directed. (FOR ICD-9 250.00, 250.01). 1 each 0  . cholecalciferol (VITAMIN D) 1000 units tablet Take 1 tablet (1,000 Units total) by mouth daily. (Patient not taking: Reported on 08/20/2018) 30 tablet 5  . pravastatin (PRAVACHOL) 40 MG tablet Take 1 tablet (40 mg total) by mouth daily. (Patient not taking: Reported on 07/28/2019) 30 tablet 5   No facility-administered medications prior to visit.     No  Known Allergies  ROS Review of Systems  Genitourinary:       Notice a few drops of urination with out know laughing or coughing denies pain. Discussed Kegel exercise and taught how to do them   All other systems reviewed and are negative.     Objective:    Physical Exam  Constitutional: She is oriented to person, place, and time. She appears well-developed and well-nourished.  HENT:  Head: Normocephalic.  Neck: Normal range of motion. Neck supple.  Cardiovascular: Normal rate and regular rhythm.  Pulmonary/Chest: Effort normal and breath sounds normal.  Abdominal: Soft. Bowel sounds are normal. She exhibits distension.  Musculoskeletal: Normal range of motion.  Neurological: She is oriented to person, place, and time.  Psychiatric: She has a normal mood and affect.    BP 113/74 (BP Location: Right Arm, Patient  Position: Sitting, Cuff Size: Normal)   Pulse 70   Temp (!) 97.5 F (36.4 C) (Temporal)   Ht _0  (1.575 m)   Wt 155 lb 12.8 oz (70.7 kg)   LMP 06/28/2019 (Approximate)   SpO2 97%   BMI 28.50 kg/m  Wt Readings from Last 3 Encounters:  07/28/19 155 lb 12.8 oz (70.7 kg)  12/15/18 162 lb 6.4 oz (73.7 kg)  12/03/18 162 lb (73.5 kg)     Health Maintenance Due  Topic Date Due  . OPHTHALMOLOGY EXAM  12/15/1993  . HIV Screening  12/16/1998  . HEMOGLOBIN A1C  12/03/2018  . URINE MICROALBUMIN  03/03/2019  . INFLUENZA VACCINE  05/15/2019    There are no preventive care reminders to display for this patient.  Lab Results  Component Value Date   TSH 3.270 12/03/2018   Lab Results  Component Value Date   WBC 6.4 02/03/2017   HGB 13.4 08/13/2018   HCT 37 08/13/2018   MCV 89.6 02/03/2017   PLT 242 08/13/2018   Lab Results  Component Value Date   NA 140 06/02/2018   K 4.1 06/02/2018   CO2 22 06/02/2018   GLUCOSE 116 (H) 06/02/2018   BUN 12 06/02/2018   CREATININE 0.58 06/02/2018   BILITOT 0.3 02/20/2017   ALKPHOS 104 02/20/2017   AST 28 02/20/2017   ALT 38 (H) 02/20/2017   PROT 7.3 02/20/2017   ALBUMIN 4.4 02/20/2017   CALCIUM 9.3 06/02/2018   ANIONGAP 13 02/03/2017   Lab Results  Component Value Date   CHOL 207 (H) 12/15/2018   Lab Results  Component Value Date   HDL 34 (L) 12/15/2018   Lab Results  Component Value Date   LDLCALC 118 (H) 12/15/2018   Lab Results  Component Value Date   TRIG 273 (H) 12/15/2018   Lab Results  Component Value Date   CHOLHDL 6.1 (H) 12/15/2018   Lab Results  Component Value Date   HGBA1C 5.8 (A) 07/28/2019      Assessment & Plan:  Sarena was seen today for follow-up.  Diagnoses and all orders for this visit  Type 2 diabetes mellitus without complication, without long-term current use of insulin ([place]) Well controlled A1C 5.8 on oral diabetic medication metformin .  -    HgB A1c -     Lipid Panel -     Cancel:  COMPLETE METABOLIC PANEL WITH GFR -     CBC with Differential -     Microalbumin, urine -     Ambulatory referral to Ophthalmology -     CMP14+EGFR; Future -     CMP14+EGFR  Elevated lipoprotein(a)  Elevated cholesterol that can lead to heart attack and stroke. Decrease your fatty foods, red meat, cheese, milk and increase fiber like whole grains and veggies. You can also add a fiber supplement like Metamucil or Benefiber.  Urinary incontinence without sensory awareness Taught Kegel exercise to strengthen muscles tone. Denies any pain/burning with urination.   Screening for HIV (human immunodeficiency virus) -     HIV Antibody (routine testing w rflx)  Menorrhagia with regular cycle She has  prolonged vaginal bleeding with menstrual cycles.    Menstrual cycles is heavy and painful Advised to take ibuprofen 2 days before cycle to decrease pain and may decrease duration. -     ibuprofen (ADVIL) 600 MG tablet; Take 1 tablet (600 mg total) by mouth every 8 (eight) hours as needed.  Encounter for diabetic foot exam ([place]) Sensory exam of the foot is normal, tested with the monofilament. Good pulses, good peripheral pulses. Crusty white skin between toes. Advised to make sure after bathing to dry between toes.   Need for immunization against influenza -     Flu Vaccine QUAD 36+ mos IM    Meds ordered this encounter  Medications  . ibuprofen (ADVIL) 600 MG tablet    Sig: Take 1 tablet (600 mg total) by mouth every 8 (eight) hours as needed.    Dispense:  30 tablet    Refill:  3    Follow-up: Return in about 6 months (around 01/26/2020) for prediabetes.    Kerin Perna, NP

## 2019-07-29 LAB — CMP14+EGFR
ALT: 33 IU/L — ABNORMAL HIGH (ref 0–32)
AST: 22 IU/L (ref 0–40)
Albumin/Globulin Ratio: 1.1 — ABNORMAL LOW (ref 1.2–2.2)
Albumin: 4.1 g/dL (ref 3.8–4.8)
Alkaline Phosphatase: 68 IU/L (ref 39–117)
BUN/Creatinine Ratio: 12 (ref 9–23)
BUN: 8 mg/dL (ref 6–20)
Bilirubin Total: 0.3 mg/dL (ref 0.0–1.2)
CO2: 22 mmol/L (ref 20–29)
Calcium: 9 mg/dL (ref 8.7–10.2)
Chloride: 107 mmol/L — ABNORMAL HIGH (ref 96–106)
Creatinine, Ser: 0.68 mg/dL (ref 0.57–1.00)
GFR calc Af Amer: 131 mL/min/{1.73_m2} (ref >59)
GFR calc non Af Amer: 114 mL/min/{1.73_m2} (ref >59)
Globulin, Total: 3.7 g/dL (ref 1.5–4.5)
Glucose: 105 mg/dL — ABNORMAL HIGH (ref 65–99)
Potassium: 4.5 mmol/L (ref 3.5–5.2)
Sodium: 140 mmol/L (ref 134–144)
Total Protein: 7.8 g/dL (ref 6.0–8.5)

## 2019-07-29 LAB — CBC WITH DIFFERENTIAL/PLATELET
Basophils Absolute: 0 10*3/uL (ref 0.0–0.2)
Basos: 1 %
EOS (ABSOLUTE): 0.3 10*3/uL (ref 0.0–0.4)
Eos: 5 %
Hematocrit: 38.2 % (ref 34.0–46.6)
Hemoglobin: 11.8 g/dL (ref 11.1–15.9)
Immature Grans (Abs): 0 10*3/uL (ref 0.0–0.1)
Immature Granulocytes: 0 %
Lymphocytes Absolute: 1.8 10*3/uL (ref 0.7–3.1)
Lymphs: 29 %
MCH: 26.3 pg — ABNORMAL LOW (ref 26.6–33.0)
MCHC: 30.9 g/dL — ABNORMAL LOW (ref 31.5–35.7)
MCV: 85 fL (ref 79–97)
Monocytes Absolute: 0.4 10*3/uL (ref 0.1–0.9)
Monocytes: 6 %
Neutrophils Absolute: 3.7 10*3/uL (ref 1.4–7.0)
Neutrophils: 59 %
Platelets: 304 10*3/uL (ref 150–450)
RBC: 4.49 x10E6/uL (ref 3.77–5.28)
RDW: 13.2 % (ref 11.7–15.4)
WBC: 6.3 10*3/uL (ref 3.4–10.8)

## 2019-07-29 LAB — LIPID PANEL
Chol/HDL Ratio: 4.4 ratio (ref 0.0–4.4)
Cholesterol, Total: 198 mg/dL (ref 100–199)
HDL: 45 mg/dL (ref >39)
LDL Chol Calc (NIH): 125 mg/dL — ABNORMAL HIGH (ref 0–99)
Triglycerides: 159 mg/dL — ABNORMAL HIGH (ref 0–149)
VLDL Cholesterol Cal: 28 mg/dL (ref 5–40)

## 2019-07-29 LAB — HIV ANTIBODY (ROUTINE TESTING W REFLEX): HIV Screen 4th Generation wRfx: NONREACTIVE

## 2019-07-29 LAB — MICROALBUMIN, URINE: Microalbumin, Urine: 16.7 ug/mL

## 2019-08-02 ENCOUNTER — Other Ambulatory Visit (INDEPENDENT_AMBULATORY_CARE_PROVIDER_SITE_OTHER): Payer: Self-pay | Admitting: Primary Care

## 2019-08-02 DIAGNOSIS — E782 Mixed hyperlipidemia: Secondary | ICD-10-CM

## 2019-08-02 MED ORDER — PRAVASTATIN SODIUM 40 MG PO TABS: 40.0000 mg | ORAL_TABLET | Freq: Every day | ORAL | 5 refills | Status: DC

## 2019-08-04 ENCOUNTER — Telehealth (INDEPENDENT_AMBULATORY_CARE_PROVIDER_SITE_OTHER): Payer: Self-pay

## 2019-08-04 NOTE — Telephone Encounter (Signed)
-----   Message from Kerin Perna, NP sent at 08/02/2019  3:49 PM EDT ----- I have reviewed all labs and they are normal except TG improved from 273 to 159 , LDL elevated bad cholesterol. Take Pravastatin 40mg  nightly decrease your fatty foods, red meat, cheese, milk and increase fiber like whole grains and veggies. You can also add a fiber supplement like Metamucil or Benefiber.

## 2019-08-04 NOTE — Telephone Encounter (Signed)
Call placed using pacific interpreter [phone]) patient is aware that all labs are normal except TG improved from 273 to 159 , LDL elevated bad cholesterol. Take Pravastatin 40mg  nightly decrease your fatty foods, red meat, cheese, milk and increase fiber like whole grains and veggies. You can also add a fiber supplement like Metamucil or Benefiber.. patient expressed understanding of results and did not have any questions. Nat Christen, CMA

## 2020-07-05 ENCOUNTER — Ambulatory Visit (INDEPENDENT_AMBULATORY_CARE_PROVIDER_SITE_OTHER): Payer: Self-pay | Admitting: Primary Care

## 2020-07-05 ENCOUNTER — Other Ambulatory Visit: Payer: Self-pay

## 2020-07-05 ENCOUNTER — Encounter (INDEPENDENT_AMBULATORY_CARE_PROVIDER_SITE_OTHER): Payer: Self-pay

## 2020-07-05 ENCOUNTER — Encounter (INDEPENDENT_AMBULATORY_CARE_PROVIDER_SITE_OTHER): Payer: Self-pay | Admitting: Primary Care

## 2020-07-05 VITALS — BP 109/71 | HR 65 | Temp 97.3°F | Ht 62.0 in | Wt 164.8 lb

## 2020-07-05 DIAGNOSIS — Z114 Encounter for screening for human immunodeficiency virus [HIV]: Secondary | ICD-10-CM

## 2020-07-05 DIAGNOSIS — R7303 Prediabetes: Secondary | ICD-10-CM

## 2020-07-05 DIAGNOSIS — Z1159 Encounter for screening for other viral diseases: Secondary | ICD-10-CM

## 2020-07-05 LAB — POCT GLYCOSYLATED HEMOGLOBIN (HGB A1C): Hemoglobin A1C: 6.3 % — AB (ref 4.0–5.6)

## 2020-07-05 NOTE — Patient Instructions (Addendum)
Schedule fasting labs   Diabetes mellitus y nutricin, en adultos Diabetes Mellitus and Nutrition, Adult Si sufre de diabetes (diabetes mellitus), es muy importante tener hbitos alimenticios saludables debido a que sus niveles de Psychologist, counselling sangre (glucosa) se ven afectados en gran medida por lo que come y bebe. [place] alimentos saludables en las cantidades [place], aproximadamente a la [employer], [place] ayudar a:  Scientist, physiological glucemia.  Disminuir el riesgo de sufrir una enfermedad cardaca.  Mejorar la presin arterial.  Barista o mantener un peso saludable. Todas las personas que sufren de diabetes son diferentes y cada una tiene necesidades diferentes en cuanto a un plan de alimentacin. El mdico puede recomendarle que trabaje con un especialista en dietas y nutricin (nutricionista) para Tax adviser plan para usted. Su plan de alimentacin puede variar segn factores como:  Las caloras que necesita.  Los medicamentos que toma.  Su peso.  Sus niveles de glucemia, presin arterial y colesterol.  Su nivel de [place].  Otras afecciones que tenga, como enfermedades cardacas o renales. Cmo me afectan los carbohidratos? Los carbohidratos, o hidratos de carbono, afectan su nivel de glucemia ms que cualquier otro tipo de alimento. La ingesta de carbohidratos naturalmente aumenta la cantidad de [employer]. El recuento de carbohidratos es un mtodo destinado a Midwife un registro de la cantidad de carbohidratos que se consumen. El recuento de carbohidratos es importante para Pharmacologist la glucemia a un nivel saludable, especialmente si utiliza insulina o toma determinados medicamentos por va oral para la diabetes. Es importante conocer la cantidad de carbohidratos que se pueden ingerir en cada comida sin correr Surveyor, minerals. Esto es Government social research officer. Su nutricionista puede ayudarlo a calcular la cantidad de carbohidratos que debe ingerir en cada  comida y en cada refrigerio. Entre los alimentos que contienen carbohidratos, se incluyen:  Pan, cereal, arroz, pastas y galletas.  Papas y maz.  Guisantes, frijoles y lentejas.  Leche y Dentist.  Nils Pyle y [place].  Postres, como pasteles, galletas, helado y caramelos. Cmo me afecta el alcohol? El alcohol puede provocar disminuciones sbitas de la glucemia (hipoglucemia), especialmente si utiliza insulina o toma determinados medicamentos por va oral para la diabetes. La hipoglucemia es una afeccin potencialmente mortal. Los sntomas de la hipoglucemia (somnolencia, mareos y confusin) son similares a los sntomas de haber consumido demasiado alcohol. Si el mdico afirma que el alcohol es seguro para usted, [place] estas pautas:  Limite el consumo de alcohol a no ms de por da si es mujer y no est [place], y a si es hombre. Una medida equivale a 12oz ( ) de cerveza, 5oz ( ) de vino o 1oz (15ml) de bebidas alcohlicas de alta graduacin.  No beba con el estmago vaco.  Mantngase hidratado bebiendo agua, refrescos dietticos o t helado sin azcar.  Tenga en cuenta que los refrescos comunes, los jugos y otras bebida para Engineer, manufacturing pueden contener mucha azcar y se deben contar como carbohidratos. Cules son algunos consejos para seguir este plan?  Leer las etiquetas de los alimentos  Comience por leer el tamao de la porcin en la "Informacin nutricional" en las etiquetas de los alimentos envasados y las bebidas. La cantidad de caloras, carbohidratos, grasas y otros nutrientes mencionados en la etiqueta se basan en una porcin del alimento. Muchos alimentos contienen ms de una porcin por envase.  Verifique la cantidad total de gramos (g) de carbohidratos totales en una porcin. Puede calcular la cantidad de porciones  de carbohidratos al dividir el total de carbohidratos por 15. Por ejemplo, si un alimento tiene un total de 30g de carbohidratos,  equivale a 2 porciones de carbohidratos.  Verifique la cantidad de gramos (g) de grasas saturadas y grasas trans en una porcin. Escoja alimentos que no contengan grasa o que tengan un bajo contenido.  Verifique la cantidad de miligramos (mg) de sal (sodio) en una porcin. [place] [employer] de las personas deben limitar la ingesta de sodio total a menos de 2300mg  por .  Siempre consulte la informacin nutricional de los alimentos etiquetados como "con bajo contenido de grasa" o "sin grasa". Estos alimentos pueden tener un mayor contenido de Futures trader agregada o carbohidratos refinados, y deben evitarse.  Hable con su nutricionista para identificar sus objetivos diarios en cuanto a los nutrientes mencionados en la etiqueta. Al ir de compras  Evite comprar alimentos procesados, enlatados o precocinados. Estos alimentos tienden a International aid/development worker mayor cantidad de [place], sodio y azcar agregada.  Compre en la zona exterior de la tienda de comestibles. Esta zona incluye frutas y verduras frescas, granos a granel, carnes frescas y productos lcteos frescos. Al cocinar  Utilice mtodos de coccin a baja temperatura, como hornear, en lugar de mtodos de coccin a alta temperatura, como frer en abundante aceite.  Cocine con aceites saludables, como el aceite de [place], [place]ola o [place].  Evite cocinar con manteca, crema o carnes con alto contenido de grasa. Planificacin de las comidas  Coma las comidas y los refrigerios regularmente, preferentemente a la misma hora todos [place]. Evite pasar largos perodos de tiempo sin comer.  Consuma alimentos ricos en fibra, como frutas frescas, verduras, frijoles y cereales integrales. Consulte a su nutricionista sobre cuntas porciones de carbohidratos puede consumir en cada comida.  Consuma entre 4 y 6 onzas (oz) de protenas magras por da, como carnes [place], pollo, pescado, huevos o tofu. Una onza de protena magra equivale a: ? 1 onza de carne, pollo o  pescado. ? 1huevo. ?  taza de tofu.  Coma algunos alimentos por da que contengan grasas saludables, como aguacates, frutos secos, semillas y pescado. Estilo de vida  Controle su nivel de glucemia con regularidad.  Haga actividad fsica habitualmente como se lo haya indicado el mdico. Esto puede incluir lo siguiente: ? [place] semanales de ejercicio de intensidad moderada o alta. Esto podra incluir caminatas dinmicas, ciclismo o gimnasia acutica. ? Realizar ejercicios de elongacin y de fortalecimiento, como yoga o levantamiento de pesas, por lo menos 2veces por semana.  Tome los se lo haya indicado el mdico.  No consuma ningn producto que contenga nicotina o tabaco, como cigarrillos y [employer]. Si necesita ayuda para dejar de fumar, consulte al Administrator, Civil Service con un asesor o instructor en diabetes para identificar estrategias para controlar el estrs y cualquier desafo emocional y social. Preguntas para hacerle al mdico  Es necesario que consulte a CIGNA en el cuidado de la diabetes?  Es necesario que me rena con un nutricionista?  A qu nmero puedo llamar si tengo preguntas?  Cules son los mejores momentos para controlar la glucemia? Dnde encontrar ms informacin:  Asociacin Estadounidense de la Diabetes (American Diabetes Association): diabetes.org  Academia de Nutricin y IT trainer (Academy of Nutrition and Dietetics): www.eatright.org  Pension scheme manager de la Diabetes y las Enfermedades Digestivas y [place] [facility] of Diabetes and Digestive and Kidney Diseases, NIH): [facility] Resumen  Un plan de alimentacin saludable lo ayudar a [email or web address] glucemia y Scientist, physiological  un estilo de vida saludable.  Trabajar con un especialista en dietas y nutricin (nutricionista) puede ayudarlo a Designer, television/film set de alimentacin para usted.  Tenga en cuenta que los carbohidratos (hidratos de  carbono) y el alcohol tienen efectos inmediatos en sus niveles de glucemia. Es importante contar los carbohidratos que ingiere y consumir alcohol con prudencia. Esta informacin no tiene Theme park manager el consejo del mdico. Asegrese de hacerle al mdico cualquier pregunta que tenga. Document Revised: 06/10/2017 Document Reviewed: 01/20/2017 Elsevier Patient Education  2020 [employer].

## 2020-07-09 NOTE — Progress Notes (Signed)
Established Patient Office Visit  Subjective:  Patient ID: Lindsay Hunt, female    DOB: 1985/10/24  Age: 34 y.o. MRN: [id number]  CC:  Chief Complaint  Patient presents with  . Diabetes    HPI Lindsay Hunt is a 33 year old Hispanic female who presents for management of pre diabetes, she denies polydipsia, polyuria and polyphagia.   Past Medical History:  Diagnosis Date  . Diabetes mellitus without complication ([place])   . Hyperlipidemia   . Hypertension     Past Surgical History:  Procedure Laterality Date  . NO PAST SURGERIES      Family History  Problem Relation Age of Onset  . Diabetes Mother   . Cancer Mother   . Diabetes Father   . Diabetes Sister   . Diabetes Brother   . Diabetes Maternal Grandmother   . Cancer Maternal Grandmother   . Diabetes Maternal Grandfather   . Diabetes Paternal Grandmother   . Diabetes Paternal Grandfather     Social History   Socioeconomic History  . Marital status: Married    Spouse name: Not on file  . Number of children: Not on file  . Years of education: Not on file  . Highest education level: Not on file  Occupational History  . Not on file  Tobacco Use  . Smoking status: Never Smoker  . Smokeless tobacco: Never Used  Vaping Use  . Vaping Use: Never used  Substance and Sexual Activity  . Alcohol use: No  . Drug use: Never  . Sexual activity: Not Currently    Birth control/protection: None  Other Topics Concern  . Not on file  Social History Narrative  . Not on file   Social Determinants of Health   Financial Resource Strain:   . Difficulty of Paying Living Expenses: Not on file  Food Insecurity:   . Worried About Charity fundraiser in the Last Year: Not on file  . Ran Out of Food in the Last Year: Not on file  Transportation Needs:   . Lack of Transportation (Medical): Not on file  . Lack of Transportation (Non-Medical): Not on file  Physical Activity:   . Days of Exercise per Week: Not  on file  . Minutes of Exercise per Session: Not on file  Stress:   . Feeling of Stress : Not on file  Social Connections:   . Frequency of Communication with Friends and Family: Not on file  . Frequency of Social Gatherings with Friends and Family: Not on file  . Attends Religious Services: Not on file  . Active Member of Clubs or Organizations: Not on file  . Attends Archivist Meetings: Not on file  . Marital Status: Not on file  Intimate Partner Violence:   . Fear of Current or Ex-Partner: Not on file  . Emotionally Abused: Not on file  . Physically Abused: Not on file  . Sexually Abused: Not on file    Outpatient Medications Prior to Visit  Medication Sig Dispense Refill  . blood glucose meter kit and supplies KIT Dispense based on patient and insurance preference. Use up to four times daily as directed. (FOR ICD-9 250.00, 250.01). 1 each 0  . pravastatin (PRAVACHOL) 40 MG tablet Take 1 tablet (40 mg total) by mouth daily. (Patient not taking: Reported on 07/05/2020) 30 tablet 5  . cholecalciferol (VITAMIN D) 1000 units tablet Take 1 tablet (1,000 Units total) by mouth daily. (Patient not taking: Reported on 08/20/2018) 30 tablet  5  . ibuprofen (ADVIL) 600 MG tablet Take 1 tablet (600 mg total) by mouth every 8 (eight) hours as needed. 30 tablet 3   No facility-administered medications prior to visit.    No Known Allergies  ROS Review of Systems    Objective:    Physical Exam  BP 109/71 (BP Location: Right Arm, Patient Position: Sitting, Cuff Size: Normal)   Pulse 65   Temp (!) 97.3 F (36.3 C) (Temporal)   Ht _0  (1.575 m)   Wt 164 lb 12.8 oz (74.8 kg)   LMP 07/01/2020 (Exact Date)   SpO2 98%   BMI 30.14 kg/m  Wt Readings from Last 3 Encounters:  07/05/20 164 lb 12.8 oz (74.8 kg)  07/28/19 155 lb 12.8 oz (70.7 kg)  12/15/18 162 lb 6.4 oz (73.7 kg)     Health Maintenance Due  Topic Date Due  . Hepatitis C Screening  Never done  . OPHTHALMOLOGY  EXAM  Never done  . INFLUENZA VACCINE  05/14/2020  . URINE MICROALBUMIN  07/27/2020    There are no preventive care reminders to display for this patient.  Lab Results  Component Value Date   TSH 3.270 12/03/2018   Lab Results  Component Value Date   WBC 6.3 07/28/2019   HGB 11.8 07/28/2019   HCT 38.2 07/28/2019   MCV 85 07/28/2019   PLT 304 07/28/2019   Lab Results  Component Value Date   NA 140 07/28/2019   K 4.5 07/28/2019   CO2 22 07/28/2019   GLUCOSE 105 (H) 07/28/2019   BUN 8 07/28/2019   CREATININE 0.68 07/28/2019   BILITOT 0.3 07/28/2019   ALKPHOS 68 07/28/2019   AST 22 07/28/2019   ALT 33 (H) 07/28/2019   PROT 7.8 07/28/2019   ALBUMIN 4.1 07/28/2019   CALCIUM 9.0 07/28/2019   ANIONGAP 13 02/03/2017   Lab Results  Component Value Date   CHOL 198 07/28/2019   Lab Results  Component Value Date   HDL 45 07/28/2019   Lab Results  Component Value Date   LDLCALC 125 (H) 07/28/2019   Lab Results  Component Value Date   TRIG 159 (H) 07/28/2019   Lab Results  Component Value Date   CHOLHDL 4.4 07/28/2019   Lab Results  Component Value Date   HGBA1C 6.3 (A) 07/05/2020      Assessment & Plan:  Lindsay Hunt was seen today for diabetes.  Diagnoses and all orders for this visit:  Prediabetes -     HgB A1c 6.3  -     Microalbumin/Creatinine Ratio, Urine -     CBC with Differential/Platelet; Future -     Lipid panel; Future Pre diabetes 5.7 - 6.4  Continue foods that are high in carbohydrates are the following rice, potatoes, breads, sugars, and pastas.  Reduction in the intake (eating) will assist in lowering your blood sugars.  Encounter for hepatitis C screening test for low risk patient Healthcare maintenance and care gap   Hepatitis C Antibody; Future  Encounter for screening for HIV Healthcare maintenance and care gap -     HIV Antibody (routine testing w rflx); Future   No orders of the defined types were placed in this  encounter.   Follow-up: Return in about 6 months (around 01/02/2021) for DM.    Kerin Perna, NP

## 2020-07-19 ENCOUNTER — Other Ambulatory Visit: Payer: Self-pay

## 2020-07-19 ENCOUNTER — Other Ambulatory Visit (INDEPENDENT_AMBULATORY_CARE_PROVIDER_SITE_OTHER): Payer: Self-pay

## 2020-07-19 DIAGNOSIS — Z1159 Encounter for screening for other viral diseases: Secondary | ICD-10-CM

## 2020-07-19 DIAGNOSIS — Z114 Encounter for screening for human immunodeficiency virus [HIV]: Secondary | ICD-10-CM

## 2020-07-19 DIAGNOSIS — R7303 Prediabetes: Secondary | ICD-10-CM

## 2020-07-20 LAB — CBC WITH DIFFERENTIAL/PLATELET
Basophils Absolute: 0 10*3/uL (ref 0.0–0.2)
Basos: 1 %
EOS (ABSOLUTE): 0.2 10*3/uL (ref 0.0–0.4)
Eos: 5 %
Hematocrit: 34.6 % (ref 34.0–46.6)
Hemoglobin: 11.1 g/dL (ref 11.1–15.9)
Immature Grans (Abs): 0 10*3/uL (ref 0.0–0.1)
Immature Granulocytes: 0 %
Lymphocytes Absolute: 1.6 10*3/uL (ref 0.7–3.1)
Lymphs: 36 %
MCH: 25.5 pg — ABNORMAL LOW (ref 26.6–33.0)
MCHC: 32.1 g/dL (ref 31.5–35.7)
MCV: 79 fL (ref 79–97)
Monocytes Absolute: 0.3 10*3/uL (ref 0.1–0.9)
Monocytes: 8 %
Neutrophils Absolute: 2.2 10*3/uL (ref 1.4–7.0)
Neutrophils: 50 %
Platelets: 315 10*3/uL (ref 150–450)
RBC: 4.36 x10E6/uL (ref 3.77–5.28)
RDW: 14.9 % (ref 11.7–15.4)
WBC: 4.3 10*3/uL (ref 3.4–10.8)

## 2020-07-20 LAB — LIPID PANEL
Chol/HDL Ratio: 6.1 ratio — ABNORMAL HIGH (ref 0.0–4.4)
Cholesterol, Total: 206 mg/dL — ABNORMAL HIGH (ref 100–199)
HDL: 34 mg/dL — ABNORMAL LOW (ref >39)
LDL Chol Calc (NIH): 127 mg/dL — ABNORMAL HIGH (ref 0–99)
Triglycerides: 251 mg/dL — ABNORMAL HIGH (ref 0–149)
VLDL Cholesterol Cal: 45 mg/dL — ABNORMAL HIGH (ref 5–40)

## 2020-07-20 LAB — HEPATITIS C ANTIBODY: Hep C Virus Ab: <0.1 s/co ratio (ref 0.0–0.9)

## 2020-07-20 LAB — HIV ANTIBODY (ROUTINE TESTING W REFLEX): HIV Screen 4th Generation wRfx: NONREACTIVE

## 2020-07-21 ENCOUNTER — Telehealth (INDEPENDENT_AMBULATORY_CARE_PROVIDER_SITE_OTHER): Payer: Self-pay

## 2020-07-21 ENCOUNTER — Other Ambulatory Visit (INDEPENDENT_AMBULATORY_CARE_PROVIDER_SITE_OTHER): Payer: Self-pay | Admitting: Primary Care

## 2020-07-21 DIAGNOSIS — E782 Mixed hyperlipidemia: Secondary | ICD-10-CM

## 2020-07-21 MED ORDER — PRAVASTATIN SODIUM 80 MG PO TABS: 80.0000 mg | ORAL_TABLET | Freq: Every day | ORAL | 1 refills | Status: DC

## 2020-07-21 MED ORDER — PRAVASTATIN SODIUM 80 MG PO TABS: 40.0000 mg | ORAL_TABLET | Freq: Every day | ORAL | 1 refills | Status: DC

## 2020-07-21 NOTE — Telephone Encounter (Signed)
-----   Message from Grayce Sessions, NP sent at 07/21/2020  2:45 PM EDT ----- Cholesterol is elevated this can lead to a stroke or heart attack. Recommend greatly you start taking your pravastatin daily increased to 80 mg HIV is negative and hepatitis C. The rest of the labs were normal

## 2020-07-21 NOTE — Telephone Encounter (Signed)
Called patient with pacific interpreter [phone]) patient is aware that labs are normal except elevated cholesterol. Advised patient that cholesterol medication has been increased to 80mg  and sent to [facility] pharmacy. She verbalized understanding. [facility], CMA

## 2021-01-02 ENCOUNTER — Ambulatory Visit (INDEPENDENT_AMBULATORY_CARE_PROVIDER_SITE_OTHER): Payer: Self-pay | Admitting: Family Medicine

## 2021-01-02 ENCOUNTER — Encounter (INDEPENDENT_AMBULATORY_CARE_PROVIDER_SITE_OTHER): Payer: Self-pay | Admitting: Family Medicine

## 2021-01-02 ENCOUNTER — Other Ambulatory Visit: Payer: Self-pay

## 2021-01-02 ENCOUNTER — Other Ambulatory Visit: Payer: Self-pay | Admitting: Family Medicine

## 2021-01-02 VITALS — BP 113/82 | HR 69 | Temp 97.5°F | Ht 62.0 in | Wt 166.8 lb

## 2021-01-02 DIAGNOSIS — Z6830 Body mass index (BMI) 30.0-30.9, adult: Secondary | ICD-10-CM

## 2021-01-02 DIAGNOSIS — E119 Type 2 diabetes mellitus without complications: Secondary | ICD-10-CM

## 2021-01-02 DIAGNOSIS — E782 Mixed hyperlipidemia: Secondary | ICD-10-CM

## 2021-01-02 DIAGNOSIS — R1310 Dysphagia, unspecified: Secondary | ICD-10-CM

## 2021-01-02 DIAGNOSIS — K209 Esophagitis, unspecified without bleeding: Secondary | ICD-10-CM

## 2021-01-02 DIAGNOSIS — E6609 Other obesity due to excess calories: Secondary | ICD-10-CM

## 2021-01-02 LAB — POCT GLYCOSYLATED HEMOGLOBIN (HGB A1C): Hemoglobin A1C: 6.7 % — AB (ref 4.0–5.6)

## 2021-01-02 MED ORDER — METFORMIN HCL 1000 MG PO TABS: 1000.0000 mg | ORAL_TABLET | Freq: Two times a day (BID) | ORAL | 3 refills | Status: DC

## 2021-01-02 MED ORDER — PRAVASTATIN SODIUM 80 MG PO TABS: 80.0000 mg | ORAL_TABLET | Freq: Every day | ORAL | 1 refills | Status: DC

## 2021-01-02 MED ORDER — FAMOTIDINE 40 MG PO TABS: 40.0000 mg | ORAL_TABLET | Freq: Two times a day (BID) | ORAL | 0 refills | Status: DC | PRN

## 2021-01-02 NOTE — Progress Notes (Signed)
Patient ID: Lindsay Hunt, female    DOB: 03/25/1986, 35 y.o.   MRN: [id number]  PCP: Kerin Perna, NP  Chief Complaint  Patient presents with  . Follow-up    Diabetes      Subjective:  HPI  Lindsay Hunt is a 35 y.o. female presents for A1C and lipid check.  Prior listed medical problem history includes:Missed abortion; Hair loss; Fatigue; Elevated lipoprotein(a); and Type 2 diabetes mellitus without complication, without long-term current use of insulin (HCC) on their problem list.   Today's visit:  Diabetes Last A1C 6.3  Months prior. No recent changes to diet, however patient endorses exercises a few days per week. Checks sugar fasting daily, average 132. Denies urine frequency, increased thirst, vision changes, neuropathic pain.   Mix hyperlipidemia Last lipid panel, abnormal x 6 months ago. Started pravastatin. Taking without complication. Here today fasting and will recheck lipids today.  Obesity Body mass index is 30.51 kg/m. Exercising more routinely  Esophagitis (New Problem) Difficulty swallowing certain beverages and certain foods stuck in throat when eating. Awakening in the night sensation of something stuck in throat. No burning. No hx of acid reflux.   Social History   Socioeconomic History  . Marital status: Married    Spouse name: Not on file  . Number of children: Not on file  . Years of education: Not on file  . Highest education level: Not on file  Occupational History  . Not on file  Tobacco Use  . Smoking status: Never Smoker  . Smokeless tobacco: Never Used  Vaping Use  . Vaping Use: Never used  Substance and Sexual Activity  . Alcohol use: No  . Drug use: Never  . Sexual activity: Not Currently    Birth control/protection: None  Other Topics Concern  . Not on file  Social History Narrative  . Not on file   Social Determinants of Health   Financial Resource Strain: Not on file  Food Insecurity: Not on file   Transportation Needs: Not on file  Physical Activity: Not on file  Stress: Not on file  Social Connections: Not on file  Intimate Partner Violence: Not on file    Family History  Problem Relation Age of Onset  . Diabetes Mother   . Cancer Mother   . Diabetes Father   . Diabetes Sister   . Diabetes Brother   . Diabetes Maternal Grandmother   . Cancer Maternal Grandmother   . Diabetes Maternal Grandfather   . Diabetes Paternal Grandmother   . Diabetes Paternal Grandfather      Review of Systems  Pertinent negatives listed in HPI  No Known Allergies  Prior to Admission medications   Medication Sig Start Date End Date Taking? Authorizing Provider  blood glucose meter kit and supplies KIT Dispense based on patient and insurance preference. Use up to four times daily as directed. (FOR ICD-9 250.00, 250.01). 02/20/17  Yes Clent Demark, PA-C  famotidine (PEPCID) 40 MG tablet Take 1 tablet (40 mg total) by mouth 2 (two) times daily as needed for heartburn or indigestion. 01/02/21 03/27/21 Yes Scot Jun, FNP  metFORMIN (GLUCOPHAGE) 1000 MG tablet Take 1 tablet (1,000 mg total) by mouth 2 (two) times daily with a meal. 01/02/21  Yes Scot Jun, FNP  pravastatin (PRAVACHOL) 80 MG tablet Take 1 tablet (80 mg total) by mouth daily. 01/02/21   Scot Jun, FNP    Past Medical, Surgical Family and Social History reviewed and  updated.    Objective:   Today's Vitals   01/02/21 0906  BP: 113/82  Pulse: 69  Temp: (!) 97.5 F (36.4 C)  TempSrc: Temporal  SpO2: 96%  Weight: 166 lb 12.8 oz (75.7 kg)  Height: $Remove'5\' 2"'cOclfUj$  (1.575 m)  PainSc: 0-No pain    BP Readings from Last 3 Encounters:  01/02/21 113/82  07/05/20 109/71  07/28/19 113/74    Filed Weights   01/02/21 0906  Weight: 166 lb 12.8 oz (75.7 kg)       Physical Exam Constitutional: Patient appears obese, cooperative, no distress. HENT: Normocephalic, atraumatic, External right and left ear  normal. Oropharynx is clear and moist.  Eyes: Conjunctivae and EOM are normal. PERRLA, no scleral icterus. Neck: Normal ROM. Neck supple. No JVD. No tracheal deviation. No thyromegaly. CVS: RRR, S1/S2 +, no murmurs, no gallops, no carotid bruit.  Pulmonary: Effort and breath sounds normal, no stridor, rhonchi, wheezes, rales.  Abdominal: Soft. BS +, no distension, tenderness, rebound or guarding.  Musculoskeletal: Normal range of motion. No edema and no tenderness.  Lymphadenopathy: No lymphadenopathy noted, cervical, inguinal or axillary Neuro: Alert. Normal reflexes, muscle tone coordination. No cranial nerve deficit. Skin: Skin is warm and dry. No rash noted. Not diaphoretic. No erythema. No pallor. Psychiatric: Normal mood and affect. Behavior, judgment, thought content normal.  Lab Results  Component Value Date   POCGLU 149 (A) 12/03/2018   POCGLU 91 03/12/2017    Lab Results  Component Value Date   HGBA1C 6.7 (A) 01/02/2021        Assessment & Plan:  1. Type 2 diabetes mellitus without complication, without long-term current use of insulin (HCC) - Microalbumin/Creatinine Ratio, Urine - HgB A1c -Start Metformin -Educated regarding diet   2. Class 1 obesity due to excess calories with serious comorbidity and body mass index (BMI) of 30.0 to 30.9 in adult -Body mass index is 30.51 kg/m.   3. Mixed hyperlipidemia - Lipid panel - pravastatin (PRAVACHOL) 80 MG tablet; Take 1 tablet (80 mg total) by mouth daily.  Dispense: 90 tablet; Refill: 1  4. Esophagitis 5. Dysphagia, unspecified type -Trial Famotidine 40 mg BID PRN -GI referral if no improvement -Education written provided   A total of 35 minutes spent, greater than 50 % of this time was spent use of interpreter due to non -English speaking counseling and coordination of care.   Molli Barrows, FNP (Covering Provider) [place].  [place], [place]  [place] [phone]

## 2021-01-02 NOTE — Patient Instructions (Signed)
Diabetes mellitus y nutricin, en adultos Diabetes Mellitus and Nutrition, Adult Si sufre de diabetes, o diabetes mellitus, es muy importante tener hbitos alimenticios saludables debido a que sus niveles de Psychologist, counselling sangre (glucosa) se ven afectados en gran medida por lo que come y bebe. [place] alimentos saludables en las cantidades correctas, aproximadamente a la misma hora todos los [place], [place] ayudar a:  Scientist, physiological glucemia.  Disminuir el riesgo de sufrir una enfermedad cardaca.  Mejorar la presin arterial.  Barista o mantener un peso saludable. Qu puede afectar mi plan de alimentacin? Todas las personas que sufren de diabetes son diferentes y cada una tiene necesidades diferentes en cuanto a un plan de alimentacin. El mdico puede recomendarle que trabaje con un nutricionista para elaborar el mejor plan para usted. Su plan de alimentacin puede variar segn factores como:  Las caloras que necesita.  Los medicamentos que toma.  Su peso.  Sus niveles de glucemia, presin arterial y colesterol.  Su nivel de [place].  Otras afecciones que tenga, como enfermedades cardacas o renales. Cmo me afectan los carbohidratos? Los carbohidratos, o hidratos de carbono, afectan su nivel de glucemia ms que cualquier otro tipo de alimento. La ingesta de carbohidratos naturalmente aumenta la cantidad de [employer]. El recuento de carbohidratos es un mtodo destinado a Midwife un registro de la cantidad de carbohidratos que se consumen. El recuento de carbohidratos es importante para Pharmacologist la glucemia a un nivel saludable, especialmente si utiliza insulina o toma determinados medicamentos por va oral para la diabetes. Es importante conocer la cantidad de carbohidratos que se pueden ingerir en cada comida sin correr Surveyor, minerals. Esto es Government social research officer. Su nutricionista puede ayudarlo a calcular la cantidad de carbohidratos que debe ingerir en cada comida y en  cada refrigerio. Cmo me afecta el alcohol? El alcohol puede provocar disminuciones sbitas de la glucemia (hipoglucemia), especialmente si utiliza insulina o toma determinados medicamentos por va oral para la diabetes. La hipoglucemia es una afeccin potencialmente mortal. Los sntomas de la hipoglucemia, como somnolencia, mareos y confusin, son similares a los sntomas de haber consumido demasiado alcohol.  No beba alcohol si: ? Su mdico le indica no hacerlo. ? Est embarazada, puede estar embarazada o est tratando de quedar embarazada.  Si bebe alcohol: ? No beba con el estmago vaco. ? Limite la cantidad que bebe:  De 0 a 1 medida por da para las mujeres.  De 0 a 2 medidas por da para los hombres. ? Est atento a la cantidad de alcohol que hay en las bebidas que toma. En los [place], una medida equivale a una botella de cerveza de 12oz ( ), un vaso de vino de 5oz ( ) o un vaso de una bebida alcohlica de alta graduacin de 1oz (63ml). ? Mantngase hidratado bebiendo agua, refrescos dietticos o t helado sin azcar.  Tenga en cuenta que los refrescos comunes, los jugos y otras bebida para Engineer, manufacturing pueden contener mucha azcar y se deben contar como carbohidratos. Consejos para seguir Consulting civil engineer las etiquetas de los alimentos  Comience por leer el tamao de la porcin en la "Informacin nutricional" en las etiquetas de los alimentos envasados y las bebidas. La cantidad de caloras, carbohidratos, grasas y otros nutrientes mencionados en la etiqueta se basan en una porcin del alimento. Muchos alimentos contienen ms de una porcin por envase.  Verifique la cantidad total de gramos (g) de carbohidratos totales en una porcin. Puede calcular la cantidad de porciones  de carbohidratos al dividir el total de carbohidratos por 15. Por ejemplo, si un alimento tiene un total de 30g de carbohidratos totales por porcin, equivale a 2 porciones de  carbohidratos.  Verifique la cantidad de gramos (g) de grasas saturadas y grasas trans de una porcin. Escoja alimentos que no contengan estas grasas o que su contenido de estas sea [place].  Verifique la cantidad de miligramos (mg) de sal (sodio) en una porcin. La [employer] de las personas deben limitar la ingesta de sodio total a menos de 2333m por dTraining and development officer  Siempre consulte la informacin nutricional de los alimentos etiquetados como "con bajo contenido de grasa" o "sin grasa". Estos alimentos pueden tener un mayor contenido de aLocation manageragregada o carbohidratos refinados, y deben evitarse.  Hable con su nutricionista para identificar sus objetivos diarios en cuanto a los nutrientes mencionados en la etiqueta. Al ir de compras  Evite comprar alimentos procesados, enlatados o precocidos. Estos alimentos tienden a tSpecial educational needs teachermayor cantidad de [place] sodio y azcar agregada.  Compre en la zona exterior de la tienda de comestibles. Esta es la zona donde se encuentran con mayor frecuencia las frutas y las verduras frescas, los cereales a granel, las carnes frescas y los productos lcteos frescos. Al cocinar  Utilice mtodos de coccin a baja temperatura, como hornear, en lugar de mtodos de coccin a alta temperatura, como frer en abundante aceite.  Cocine con aceites saludables, como el aceite de oFrontier canola o gPiketon  Evite cocinar con manteca, crema o carnes con alto contenido de grasa. Planificacin de las comidas  Coma las comidas y los refrigerios regularmente, preferentemente a la misma hora todos lK[place] Evite pasar largos perodos de tiempo sin comer.  Consuma alimentos ricos en fibra, como frutas frescas, verduras, frijoles y cereales integrales. Consulte a su nutricionista sobre cuntas porciones de carbohidratos puede consumir en cada comida.  Consuma entre 4 y 6 onzas (entre 112 y 168g) de protenas magras por da, como carnes magras, pollo, pescado, huevos o tofu. Una onza (oz) de  protena magra equivale a: ? 1 onza (28g) de carne, pollo o pescado. ? 1huevo. ?  de taza (62 g) de tofu.  Coma algunos alimentos por da que contengan grasas saludables, como aguacates, frutos secos, semillas y pescado.   Qu alimentos debo comer? FLambert ModyBayas. Manzanas. Naranjas. Duraznos. Damascos. Ciruelas. Uvas. Mango. Papaya. GTolna Kiwi. Cerezas. VHolland CommonsLValeda Malm Espinaca. Verduras de hBoeing que incluyen col rizada, aFolsom hojas de bIraqy de mFountain Remolachas. Coliflor. Repollo. Brcoli. Zanahorias. Judas verdes. Tomates. Pimientos. Cebollas. Pepinos. Coles de Bruselas. Granos Granos integrales, como panes, galletas, tortillas, cereales y pastas de salvado o integrales. Avena sin azcar. Quinua. Arroz integral o salvaje. Carnes y oPsychiatric nurse Carne de ave sin piel. Cortes magros de ave y carne de res. Tofu. Frutos secos. Semillas. Lcteos Productos lcteos sin grasa o con bajo contenido de gPepin cUpper Stewartsville yogur y qKahaluu-Keauhou Es posible que los productos que se enumeran ms aNew Caledoniano constituyan una lista completa de los alimentos y las bebidas que puede tomar. Consulte a un nutricionista para obtener ms informacin. Qu alimentos debo evitar? FLambert ModyFrutas enlatadas al almbar. Verduras Verduras enlatadas. Verduras congeladas con mantequilla o salsa de crema. Granos Productos elaborados con hIsraely hLao People's Democratic Republic como panes, pastas, bocadillos y cereales. Evite todos los alimentos procesados. Carnes y otras protenas Cortes de carne con alto contenido de gLobbyist Carne de ave con piel. Carnes empanizadas o fritas. Carne procesada. Evite las grasas saturadas.  Lcteos Yogur, queso o Cardinal Healthleche enteros. Bebidas Bebidas azucaradas, como gaseosas o t helado. Es posible que los productos que se enumeran ms Seychellesarriba no constituyan una lista completa de los alimentos y las bebidas que Personnel officerdebe evitar. Consulte a un nutricionista para obtener ms  informacin. Preguntas para hacerle al mdico  Es necesario que me rena con IT trainerun instructor en el cuidado de la diabetes?  Es necesario que me rena con un nutricionista?  A qu nmero puedo llamar si tengo preguntas?  Cules son los mejores momentos para controlar la glucemia? Dnde encontrar ms informacin:  Asociacin Estadounidense de la Diabetes (American Diabetes Association): diabetes.org  Academy of Nutrition and Dietetics (Academia de Nutricin y Pension scheme managerDiettica): www.eatright.AK Steel Holding Corporationorg  National Institute of Diabetes and Digestive and Kidney Diseases [employer](Instituto Nacional de la Diabetes y las Enfermedades Digestivas y Renales): CarFlippers.tnwww.niddk.nih.gov  Association of Diabetes Care and Education Specialists (Asociacin de Especialistas en Atencin y Educacin sobre la Diabetes): www.diabeteseducator.org Resumen  Es importante tener hbitos alimenticios saludables debido a que sus niveles de Psychologist, counsellingazcar en la sangre (glucosa) se ven afectados en gran medida por lo que come y bebe.  Un plan de alimentacin saludable lo ayudar a controlar la glucemia y Pharmacologistmantener un estilo de vida saludable.  El mdico puede recomendarle que trabaje con un nutricionista para elaborar el mejor plan para usted.  Tenga en cuenta que los carbohidratos (hidratos de carbono) y el alcohol tienen efectos inmediatos en sus niveles de glucemia. Es importante contar los carbohidratos que ingiere y consumir alcohol con prudencia. Esta informacin no tiene Theme park managercomo fin reemplazar el consejo del mdico. Asegrese de hacerle al mdico cualquier pregunta que tenga. Document Revised: 11/04/2019 Document Reviewed: 11/04/2019 Elsevier Patient Education  2021 Elsevier Inc.        Esofagitis Esophagitis  La esofagitis es la inflamacin del esfago. El esfago es un conducto que transporta los alimentos y bebidas desde la boca al Ringstedestmago. La esofagitis puede causar molestias o dolor en el esfago. Este problema hacer que tragar sea  difcil y doloroso. Cules son las causas? La mayora de las causas que producen esofagitis no son graves. Las causas ms frecuentes de esta afeccin Wm. Wrigley Jr. Companyincluyen las siguientes:  Enfermedad de reflujo gastroesofgico (ERGE). Ocurre cuando el contenido del estmago vuelve a subir hacia el esfago (reflujo).  Vmitos repetidos.  Una reaccin alrgica, especialmente causada por alergias alimentarias (esofagitis eosinoflica).  Lesin en el esfago al tragar grandes pldoras con o sin agua, o al tragar ciertos tipos de medicamentos.  Tragar productos qumicos nocivos, tales como productos de limpieza del hogar.  Beber mucho alcohol.  Una infeccin del esfago. Afecta en mayor medida a personas que tienen un sistema inmunitario debilitado.  Tratamiento de radiacin o quimioterapia para Management consultantel cncer.  Ciertas enfermedades como la sarcoidosis, enfermedad de Crohn, y la esclerodermia. Cules son los signos o sntomas? Los sntomas de esta afeccin incluyen:  Dificultad o dolor al tragar.  Dolor al tragar lquidos cidos, como jugos ctricos. Tambin puede tener dolor al Enterprise Productseructar.  Dolor en el pecho o dificultad para respirar.  Nuseas y vmitos.  Dolor en el abdomen.  Prdida de peso.  lceras en la boca y manchas blancas en la boca (candidiasis).  Grant RutsFiebre.  Vomitar o Community education officerexpectorar sangre.  Heces de color negro, aspecto alquitranado o de color rojo brillante. Cmo se diagnostica? Esta afeccin se puede diagnosticar en funcin de los antecedentes mdicos y un examen fsico. Tambin pueden hacerle otras pruebas, incluidas las siguientes:  Una prueba para examinarle el esfago y  el estmago con un tubo pequeo flexible con una cmara (endoscopio).  Una prueba para medir el grado de Technical sales engineer.  Una prueba para medir cunta presin hay en el esfago.  Un estudio de trnsito baritado regular o modificado para ver la forma, el tamao y el funcionamiento del esfago.  Pruebas  de alergia. Cmo se trata? El tratamiento de esta afeccin depende de la causa de su esofagitis. En algunos casos, le recetarn corticoides u otros medicamentos para ayudar a Paramedic sus sntomas o tratar la causa subyacente del problema. Es posible que deba hacer algunos cambios en su estilo de vida, como:  Evitar el alcohol.  Dejar de Emergency planning/management officer producto que contenga nicotina o tabaco. Estos productos incluyen cigarrillos, tabaco para Theatre manager y aparatos de vapeo, como los Administrator, Civil Service. Si necesita ayuda para dejar de fumar, consulte al mdico.  Cambios en la dieta.  Realizar actividad fsica.  Cambiar los hbitos de sueo y el entorno de sueo. Siga estas instrucciones en su casa: Medicamentos  Use los medicamentos de venta libre y los recetados solamente como se lo haya indicado el mdico.  No tome aspirina, ibuprofeno ni otros antiinflamatorios no esteroideos (AINE) a menos que el mdico se lo indique.  Si tiene problemas para tomar pldoras: ? Use un divisor de pldoras para disminuir el tamao de la pldora. Esto disminuir la probabilidad de que la pldora se atasque o se lesione Training and development officer. ? Beba agua despus de tomar una pldora. Comida y bebida  Evite las comidas y bebidas que [employer] problemas.  Siga la dieta recomendada por el mdico. Esto puede incluir evitar ciertos alimentos y bebidas, por ejemplo: ? Caf y t negro, con o sin cafena. ? Bebidas que contengan alcohol. ? Bebidas energticas y deportivas. ? Bebidas gaseosas o refrescos. ? Chocolate y cacao. ? Menta y esencia de [place]. ? Ajo y cebolla. ? Rbano picante. ? Alimentos condimentados, picantes y cidos, por ejemplo, todos los tipos de pimientas, [place] en polvo, curry en polvo, vinagre, salsas picantes y Occidental Petroleum. ? Ctricos y sus jugos, por ejemplo, naranjas, limones y limas. ? Alimentos a base de [address], como salsa de [place], [place], salsa picante y pizza con salsa de  [place]. ? Alimentos fritos y [place], Cidra donas, papas fritas y aderezos ricos en grasas. ? Carnes con alto contenido de grasa, como salchichas, y cortes de carnes rojas y blancas con mucha grasa, por ejemplo, chuletas o costillas, embutidos, jamn y tocino. ? Productos lcteos ricos en grasas, como leche [place], [place] y [place] crema.   Estilo de vida  Haga comidas pequeas y frecuentes Freight forwarder de comidas abundantes.  Evite beber grandes cantidades de lquidos con las comidas.  Evite comer 2 o 3horas antes de acostarse.  Evite recostarse inmediatamente despus de comer.  No haga ejercicios enseguida despus de comer.  No consuma ningn producto que contenga nicotina o tabaco. Estos productos incluyen cigarrillos, tabaco para Theatre manager y aparatos de vapeo, como los Administrator, Civil Service. Si necesita ayuda para dejar de fumar, consulte al mdico. Instrucciones generales  Est atento a cualquier cambio en los sntomas. Informe a su mdico acerca de sus sntomas.  Use ropa suelta. No use nada apretado alrededor de la cintura que haga presin sobre el abdomen.  Levante (eleve) la cabecera de la cama aproximadamente 6pulgadas (15cm). Para hacerlo, es posible que tenga que utilizar una cua.  Pruebe estrategias de relajacin, como yoga, respiracin profunda o meditacin para controlar el estrs.  Si necesita ayuda para reducir [employer] de estrs, consulte al mdico.  Si tiene sobrepeso, baje hasta llegar a un peso saludable para usted. Pdale consejos al mdico para bajar de peso de [place] segura.  Cumpla con todas las visitas de seguimiento. Esto es importante.   Comunquese con un mdico si:  Aparecen nuevos sntomas.  Baja de peso sin causa aparente.  Tiene dificultad para tragar, o le duele cuando traga.  Tiene sibilancias o una tos que no desaparece.  Los sntomas no mejoran con Scientist, research (medical).  Tiene acidez estomacal con frecuencia durante ms de  dossemanas. Solicite ayuda de inmediato si:  Siente dolor intenso repentino Sears Holdings Corporation, el cuello, la [place], los dientes o la espalda.  De repente se siente transpirado, mareado o aturdido.  Siente falta de aire o Journalist, newspaper.  Vomita y el vmito es de color verde, amarillo o negro, o tiene un aspecto similar a la sangre o a los posos de caf.  Las heces son rojas, sanguinolentas o negras.  Tiene fiebre.  No puede tragar, beber o comer. Estos sntomas pueden representar un problema grave que constituye Radio broadcast assistant. No espere a ver si los sntomas desaparecen. Solicite atencin mdica de inmediato. Comunquese con el servicio de emergencias de su localidad (911 en los [place]). No conduzca por sus propios medios [employer]Max Incorporated. Resumen  La esofagitis es la inflamacin del esfago.  La mayora de las causas que producen esofagitis no son graves.  Siga las indicaciones del mdico sobre qu debe comer y beber.  Comunquese con un mdico si tiene sntomas nuevos, prdida de peso o tos que no se detiene.  Obtenga ayuda de inmediato si tiene dolor [employer] Lee, el cuello, la [place], los dientes o la espalda, o si tiene Engineer, mining de [place], falta de [place] o [place]. Esta informacin no tiene Theme park manager el consejo del mdico. Asegrese de hacerle al mdico cualquier pregunta que tenga. Document Revised: 05/12/2020 Document Reviewed: 05/12/2020 Elsevier Patient Education  2021 [employer].

## 2021-01-03 LAB — MICROALBUMIN / CREATININE URINE RATIO
Creatinine, Urine: 104.9 mg/dL
Microalb/Creat Ratio: 21 mg/g creat (ref 0–29)
Microalbumin, Urine: 22.4 ug/mL

## 2021-01-03 LAB — LIPID PANEL
Chol/HDL Ratio: 4.8 ratio — ABNORMAL HIGH (ref 0.0–4.4)
Cholesterol, Total: 203 mg/dL — ABNORMAL HIGH (ref 100–199)
HDL: 42 mg/dL (ref >39)
LDL Chol Calc (NIH): 137 mg/dL — ABNORMAL HIGH (ref 0–99)
Triglycerides: 135 mg/dL (ref 0–149)
VLDL Cholesterol Cal: 24 mg/dL (ref 5–40)

## 2021-01-08 ENCOUNTER — Ambulatory Visit: Payer: Self-pay

## 2021-01-08 ENCOUNTER — Telehealth (INDEPENDENT_AMBULATORY_CARE_PROVIDER_SITE_OTHER): Payer: Self-pay

## 2021-01-08 NOTE — Telephone Encounter (Signed)
-----   Message from Bing Neighbors, FNP sent at 01/07/2021  8:22 PM EDT ----- Cholesterol is improving. Continue medication and increase physical exercise.

## 2021-01-08 NOTE — Telephone Encounter (Signed)
Call placed to patient using pacific interpreter [phone]) patient verified date of birth. She is aware that cholesterol is improving but continue taking medication and increase physical exercise. She verbalized understanding. Maryjean Morn, CMA

## 2021-02-16 ENCOUNTER — Other Ambulatory Visit: Payer: Self-pay

## 2021-02-16 MED FILL — Metformin HCl Tab 1000 MG: ORAL | 90 days supply | Qty: 180 | Fill #0 | Status: AC

## 2021-02-23 ENCOUNTER — Emergency Department (HOSPITAL_COMMUNITY)
Admission: EM | Admit: 2021-02-23 | Discharge: 2021-02-24 | Disposition: A | Discharge status: Home / Self Care | Payer: Self-pay | Attending: Emergency Medicine | Admitting: Emergency Medicine

## 2021-02-23 ENCOUNTER — Emergency Department (HOSPITAL_COMMUNITY): Payer: Self-pay

## 2021-02-23 ENCOUNTER — Encounter (HOSPITAL_COMMUNITY): Payer: Self-pay

## 2021-02-23 ENCOUNTER — Other Ambulatory Visit: Payer: Self-pay

## 2021-02-23 DIAGNOSIS — R1011 Right upper quadrant pain: Secondary | ICD-10-CM | POA: Insufficient documentation

## 2021-02-23 DIAGNOSIS — I10 Essential (primary) hypertension: Secondary | ICD-10-CM | POA: Insufficient documentation

## 2021-02-23 DIAGNOSIS — R1010 Upper abdominal pain, unspecified: Secondary | ICD-10-CM

## 2021-02-23 DIAGNOSIS — R079 Chest pain, unspecified: Secondary | ICD-10-CM | POA: Insufficient documentation

## 2021-02-23 DIAGNOSIS — R112 Nausea with vomiting, unspecified: Secondary | ICD-10-CM | POA: Insufficient documentation

## 2021-02-23 DIAGNOSIS — E119 Type 2 diabetes mellitus without complications: Secondary | ICD-10-CM | POA: Insufficient documentation

## 2021-02-23 DIAGNOSIS — Z7984 Long term (current) use of oral hypoglycemic drugs: Secondary | ICD-10-CM | POA: Insufficient documentation

## 2021-02-23 LAB — COMPREHENSIVE METABOLIC PANEL
ALT: 78 U/L — ABNORMAL HIGH (ref 0–44)
AST: 47 U/L — ABNORMAL HIGH (ref 15–41)
Albumin: 3.6 g/dL (ref 3.5–5.0)
Alkaline Phosphatase: 65 U/L (ref 38–126)
Anion gap: 6 (ref 5–15)
BUN: 11 mg/dL (ref 6–20)
CO2: 23 mmol/L (ref 22–32)
Calcium: 9 mg/dL (ref 8.9–10.3)
Chloride: 106 mmol/L (ref 98–111)
Creatinine, Ser: 0.59 mg/dL (ref 0.44–1.00)
GFR, Estimated: >60 mL/min (ref >60)
Glucose, Bld: 121 mg/dL — ABNORMAL HIGH (ref 70–99)
Potassium: 3.5 mmol/L (ref 3.5–5.1)
Sodium: 135 mmol/L (ref 135–145)
Total Bilirubin: 0.4 mg/dL (ref 0.3–1.2)
Total Protein: 6.9 g/dL (ref 6.5–8.1)

## 2021-02-23 LAB — URINALYSIS, ROUTINE W REFLEX MICROSCOPIC
Bacteria, UA: NONE SEEN
Bilirubin Urine: NEGATIVE
Glucose, UA: NEGATIVE mg/dL
Hgb urine dipstick: NEGATIVE
Ketones, ur: NEGATIVE mg/dL
Nitrite: NEGATIVE
Protein, ur: NEGATIVE mg/dL
Specific Gravity, Urine: 1.004 — ABNORMAL LOW (ref 1.005–1.030)
pH: 7 (ref 5.0–8.0)

## 2021-02-23 LAB — CBC WITH DIFFERENTIAL/PLATELET
Abs Immature Granulocytes: 0.02 10*3/uL (ref 0.00–0.07)
Basophils Absolute: 0 10*3/uL (ref 0.0–0.1)
Basophils Relative: 1 %
Eosinophils Absolute: 0.2 10*3/uL (ref 0.0–0.5)
Eosinophils Relative: 3 %
HCT: 35.6 % — ABNORMAL LOW (ref 36.0–46.0)
Hemoglobin: 11.9 g/dL — ABNORMAL LOW (ref 12.0–15.0)
Immature Granulocytes: 0 %
Lymphocytes Relative: 32 %
Lymphs Abs: 2.7 10*3/uL (ref 0.7–4.0)
MCH: 29.8 pg (ref 26.0–34.0)
MCHC: 33.4 g/dL (ref 30.0–36.0)
MCV: 89 fL (ref 80.0–100.0)
Monocytes Absolute: 0.6 10*3/uL (ref 0.1–1.0)
Monocytes Relative: 7 %
Neutro Abs: 4.9 10*3/uL (ref 1.7–7.7)
Neutrophils Relative %: 57 %
Platelets: 268 10*3/uL (ref 150–400)
RBC: 4 MIL/uL (ref 3.87–5.11)
RDW: 14.4 % (ref 11.5–15.5)
WBC: 8.4 10*3/uL (ref 4.0–10.5)
nRBC: 0 % (ref 0.0–0.2)

## 2021-02-23 LAB — I-STAT BETA HCG BLOOD, ED (MC, WL, AP ONLY): I-stat hCG, quantitative: <5 m[IU]/mL (ref <5)

## 2021-02-23 LAB — TROPONIN I (HIGH SENSITIVITY): Troponin I (High Sensitivity): 6 ng/L (ref <18)

## 2021-02-23 LAB — LIPASE, BLOOD: Lipase: 38 U/L (ref 11–51)

## 2021-02-23 NOTE — ED Provider Notes (Signed)
Emergency Medicine Provider Triage Evaluation Note  Lindsay Hunt , a 35 y.o. female  was evaluated in triage.  Pt complains of chest pain.  Chest pain began today.  Chest pain is located to right chest.  Patient describes pain as pressure.  Patient rates her pain 6/10 negative.  Patient endorses that she is having much abdominal pressure as well.  Patient also endorses nausea and vomiting with eating over the last 3 weeks.  Patient endorses diarrhea.  LMP 1 month prior.  No history of abdominal surgeries.  Review of Systems  Positive: Chest pain, nausea, abdominal pain, diarrhea, fatigue Negative: Blood in stool, fever, chills,   Physical Exam  BP 118/79 (BP Location: Right Arm)   Pulse 66   Temp 98.6 F (37 C) (Oral)   Resp 16   SpO2 99%  Gen:   Awake, no distress   Resp:  Normal effort  MSK:   Moves extremities without difficulty  Other:  Abdomen soft, nondistended, nontender.  Medical Decision Making  Medically screening exam initiated at 9:08 PM.  Appropriate orders placed.  Lindsay Hunt was informed that the remainder of the evaluation will be completed by another provider, this initial triage assessment does not replace that evaluation, and the importance of remaining in the ED until their evaluation is complete.  The patient appears stable so that the remainder of the work up may be completed by another provider.      Berneice Heinrich 02/23/21 [id number]    Terrilee Files, MD 02/24/21 [id number]

## 2021-02-23 NOTE — ED Triage Notes (Signed)
Patient reports 3 weeks of nausea, vomiting, and diarrhea, difficulty swallowing and R sided chest pain that started this morning, also with stomach pressure,

## 2021-02-24 ENCOUNTER — Emergency Department (HOSPITAL_COMMUNITY): Payer: Self-pay

## 2021-02-24 LAB — TROPONIN I (HIGH SENSITIVITY): Troponin I (High Sensitivity): 3 ng/L (ref <18)

## 2021-02-24 MED ORDER — OMEPRAZOLE 20 MG PO CPDR: 20.0000 mg | DELAYED_RELEASE_CAPSULE | Freq: Every day | ORAL | 0 refills | Status: DC

## 2021-02-24 MED ORDER — ONDANSETRON 4 MG PO TBDP: 4.0000 mg | ORAL_TABLET | Freq: Three times a day (TID) | ORAL | 0 refills | Status: DC | PRN

## 2021-02-24 NOTE — ED Notes (Signed)
Provider at bedside at this time

## 2021-02-24 NOTE — Discharge Instructions (Addendum)
Take medications as prescribed. Please call to make an appointment with your doctor for recheck and further outpatient evaluation of abdominal pain with vomiting.   Return to the ED with any high fever, severe pain or uncontrolled vomiting.

## 2021-02-24 NOTE — ED Notes (Signed)
Pt c/o cp with n/v/d earlier. Denies any pain at this time

## 2021-02-24 NOTE — ED Provider Notes (Signed)
[facility] EMERGENCY DEPARTMENT Provider Note   CSN: [id number] Arrival date & time: 02/23/21  2028     History Chief Complaint  Patient presents with  . Chest Pain    Lindsay Hunt is a 35 y.o. female.  Patient with history of T2DM, HLD, HTN presents for evaluation of abdominal pain that radiates to the chest, associated with nausea and vomiting, that started 3 weeks ago. No fever. She reports the pain is aggravated by eating. It is intermittent. No cough, fever, congestion. No urinary symptoms.   The history is provided by the patient. No language interpreter was used.  Chest Pain Associated symptoms: abdominal pain, nausea and vomiting   Associated symptoms: no back pain, no cough, no fever and no shortness of breath        Past Medical History:  Diagnosis Date  . Diabetes mellitus without complication ([place])   . Hyperlipidemia   . Hypertension     Patient Active Problem List   Diagnosis Date Noted  . Hair loss 12/03/2018  . Fatigue 12/03/2018  . 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 Type 2 diabetes mellitus without complication, without long-term current use of insulin ([place]) 12/03/2018  . Missed abortion 08/20/2018    Past Surgical History:  Procedure Laterality Date  . NO PAST SURGERIES       OB History    Gravida  4   Para  3   Term  3   Preterm      AB      Living  3     SAB      IAB      Ectopic      Multiple      Live Births  3           Family History  Problem Relation Age of Onset  . Diabetes Mother   . Cancer Mother   . Diabetes Father   . Diabetes Sister   . Diabetes Brother   . Diabetes Maternal Grandmother   . Cancer Maternal Grandmother   . Diabetes Maternal Grandfather   . Diabetes Paternal Grandmother   . Diabetes Paternal Grandfather     Social History   Tobacco Use  . Smoking status: Never Smoker  . Smokeless tobacco: Never Used  Vaping Use  . Vaping Use: Never used  Substance  Use Topics  . Alcohol use: No  . Drug use: Never    Home Medications Prior to Admission medications   Medication Sig Start Date End Date Taking? Authorizing Provider  blood glucose meter kit and supplies KIT Dispense based on patient and insurance preference. Use up to four times daily as directed. (FOR ICD-9 250.00, 250.01). 02/20/17   Clent Demark, PA-C  famotidine (PEPCID) 40 MG tablet Take 1 tablet (40 mg total) by mouth 2 (two) times daily as needed for heartburn or indigestion. 01/02/21 03/27/21  Scot Jun, FNP  famotidine (PEPCID) 40 MG tablet TAKE 1 TABLET (40 MG TOTAL) BY MOUTH 2 (TWO) TIMES DAILY AS NEEDED FOR HEARTBURN OR INDIGESTION. 01/02/21 01/02/22  Scot Jun, FNP  metFORMIN (GLUCOPHAGE) 1000 MG tablet Take 1 tablet (1,000 mg total) by mouth 2 (two) times daily with a meal. 01/02/21   Scot Jun, FNP  metFORMIN (GLUCOPHAGE) 1000 MG tablet TAKE 1 TABLET (1,000 MG TOTAL) BY MOUTH 2 (TWO) TIMES DAILY WITH A MEAL. 01/02/21 01/02/22  Scot Jun, FNP  pravastatin (PRAVACHOL) 80 MG tablet Take 1 tablet (80 mg total)  by mouth daily. 01/02/21   Bing Neighbors, FNP  pravastatin (PRAVACHOL) 80 MG tablet TAKE 1 TABLET (80 MG TOTAL) BY MOUTH DAILY. 01/02/21 01/02/22  Bing Neighbors, FNP    Allergies    Patient has no known allergies.  Review of Systems   Review of Systems  Constitutional: Negative for fever.  Respiratory: Negative for cough and shortness of breath.   Cardiovascular: Positive for chest pain.  Gastrointestinal: Positive for abdominal pain, nausea and vomiting.  Genitourinary: Negative for decreased urine volume.  Musculoskeletal: Negative for back pain.    Physical Exam Updated Vital Signs BP 103/71   Pulse 64   Temp 98.6 F (37 C) (Oral)   Resp 14   Ht 5\' 2"  (1.575 m)   Wt 75.7 kg   LMP 01/12/2021   SpO2 99%   BMI 30.52 kg/m   Physical Exam Vitals and nursing note reviewed.  Constitutional:      General: She is not  in acute distress.    Appearance: She is well-developed. She is obese.  HENT:     Head: Normocephalic.  Cardiovascular:     Rate and Rhythm: Normal rate and regular rhythm.  Pulmonary:     Effort: Pulmonary effort is normal.     Breath sounds: Normal breath sounds. No wheezing, rhonchi or rales.  Abdominal:     General: Bowel sounds are normal.     Palpations: Abdomen is soft.     Tenderness: There is no abdominal tenderness. There is no guarding or rebound.  Musculoskeletal:        General: Normal range of motion.     Cervical back: Normal range of motion and neck supple.  Skin:    General: Skin is warm and dry.     Findings: No rash.  Neurological:     Mental Status: She is alert.     ED Results / Procedures / Treatments   Labs (all labs ordered are listed, but only abnormal results are displayed) Labs Reviewed  CBC WITH DIFFERENTIAL/PLATELET - Abnormal; Notable for the following components:      Result Value   Hemoglobin 11.9 (*)    HCT 35.6 (*)    All other components within normal limits  COMPREHENSIVE METABOLIC PANEL - Abnormal; Notable for the following components:   Glucose, Bld 121 (*)    AST 47 (*)    ALT 78 (*)    All other components within normal limits  URINALYSIS, ROUTINE W REFLEX MICROSCOPIC - Abnormal; Notable for the following components:   Color, Urine STRAW (*)    Specific Gravity, Urine 1.004 (*)    Leukocytes,Ua TRACE (*)    All other components within normal limits  LIPASE, BLOOD  I-STAT BETA HCG BLOOD, ED ([facility], [facility], [facility] ONLY)  TROPONIN I (HIGH SENSITIVITY)  TROPONIN I (HIGH SENSITIVITY)   Results for orders placed or performed during the hospital encounter of 02/23/21  CBC with Differential  Result Value Ref Range   WBC 8.4 4.0 - 10.5 K/uL   RBC 4.00 3.87 - 5.11 MIL/uL   Hemoglobin 11.9 (L) 12.0 - 15.0 g/dL   HCT 02/25/21 (L) 10.3 - 01.3 %   MCV 89.0 80.0 - 100.0 fL   MCH 29.8 26.0 - 34.0 pg   MCHC 33.4 30.0 - 36.0 g/dL   RDW 14.3 88.8 - 75.7  %   Platelets 268 150 - 400 K/uL   nRBC 0.0 0.0 - 0.2 %   Neutrophils Relative % 57 %  Neutro Abs 4.9 1.7 - 7.7 K/uL   Lymphocytes Relative 32 %   Lymphs Abs 2.7 0.7 - 4.0 K/uL   Monocytes Relative 7 %   Monocytes Absolute 0.6 0.1 - 1.0 K/uL   Eosinophils Relative 3 %   Eosinophils Absolute 0.2 0.0 - 0.5 K/uL   Basophils Relative 1 %   Basophils Absolute 0.0 0.0 - 0.1 K/uL   Immature Granulocytes 0 %   Abs Immature Granulocytes 0.02 0.00 - 0.07 K/uL  Comprehensive metabolic panel  Result Value Ref Range   Sodium 135 135 - 145 mmol/L   Potassium 3.5 3.5 - 5.1 mmol/L   Chloride 106 98 - 111 mmol/L   CO2 23 22 - 32 mmol/L   Glucose, Bld 121 (H) 70 - 99 mg/dL   BUN 11 6 - 20 mg/dL   Creatinine, Ser 0.59 0.44 - 1.00 mg/dL   Calcium 9.0 8.9 - 10.3 mg/dL   Total Protein 6.9 6.5 - 8.1 g/dL   Albumin 3.6 3.5 - 5.0 g/dL   AST 47 (H) 15 - 41 U/L   ALT 78 (H) 0 - 44 U/L   Alkaline Phosphatase 65 38 - 126 U/L   Total Bilirubin 0.4 0.3 - 1.2 mg/dL   GFR, Estimated >60 >60 mL/min   Anion gap 6 5 - 15  Lipase, blood  Result Value Ref Range   Lipase 38 11 - 51 U/L  Urinalysis, Routine w reflex microscopic Urine, Clean Catch  Result Value Ref Range   Color, Urine STRAW (A) YELLOW   APPearance CLEAR CLEAR   Specific Gravity, Urine 1.004 (L) 1.005 - 1.030   pH 7.0 5.0 - 8.0   Glucose, UA NEGATIVE NEGATIVE mg/dL   Hgb urine dipstick NEGATIVE NEGATIVE   Bilirubin Urine NEGATIVE NEGATIVE   Ketones, ur NEGATIVE NEGATIVE mg/dL   Protein, ur NEGATIVE NEGATIVE mg/dL   Nitrite NEGATIVE NEGATIVE   Leukocytes,Ua TRACE (A) NEGATIVE   RBC / HPF 0-5 0 - 5 RBC/hpf   WBC, UA 0-5 0 - 5 WBC/hpf   Bacteria, UA NONE SEEN NONE SEEN   Squamous Epithelial / LPF 0-5 0 - 5   Mucus PRESENT   I-Stat Beta hCG blood, ED ([facility], [facility], [facility] only)  Result Value Ref Range   I-stat hCG, quantitative <5.0 <5 mIU/mL   Comment 3          Troponin I (High Sensitivity)  Result Value Ref Range   Troponin I (High  Sensitivity) 6 <18 ng/L  Troponin I (High Sensitivity)  Result Value Ref Range   Troponin I (High Sensitivity) 3 <18 ng/L    EKG None EKG: normal EKG, normal sinus rhythm.  Radiology US Abdomen Limited  Result Date: 02/24/2021 CLINICAL DATA:  Right upper quadrant pain EXAM: ULTRASOUND ABDOMEN LIMITED RIGHT UPPER QUADRANT COMPARISON:  None. FINDINGS: Gallbladder: No gallstones or wall thickening visualized. No sonographic Murphy sign noted by sonographer. Common bile duct: Diameter: 3 mm Liver: Echogenic liver with diminished acoustic penetration. No focal lesion is seen. Portal vein is patent on color Doppler imaging with normal direction of blood flow towards the liver. IMPRESSION: 1. Hepatic steatosis. 2. Normal gallbladder. Electronically Signed   By: Monte Fantasia M.D.   On: 02/24/2021 04:56   DG Chest Port 1 View  Result Date: 02/23/2021 CLINICAL DATA:  Chest pain.  Nausea. EXAM: PORTABLE CHEST 1 VIEW COMPARISON:  None. FINDINGS: Lung volumes are low. Bronchovascular crowding versus bronchial thickening. Normal heart size and mediastinal contours for technique. 2.1  cm tubular structure projects over the lower thorax and right aspect of the cardiac silhouette. Similar tubular density projects over the right lateral hemithorax. No pleural fluid or pneumothorax. No acute osseous abnormalities are seen. IMPRESSION: 1. Low lung volumes with bronchovascular crowding versus bronchial thickening. 2. Tubular structures project over the central and right lateral chest, likely external to the patient. Electronically Signed   By: Keith Rake M.D.   On: 02/23/2021 21:51    Procedures Procedures   Medications Ordered in ED Medications - No data to display  ED Course  I have reviewed the triage vital signs and the nursing notes.  Pertinent labs & imaging results that were available during my care of the patient were reviewed by me and considered in my medical decision making (see chart for  details).    MDM Rules/Calculators/A&P                          Patient to ED with 3 weeks of abdominal pain, radiates to chest, occurs after eating. No fever. It is associated with N, V.   Labs are reassuring. No fever, VSS. There is a very slight increase in transaminase, normal total bilirubin. She reports her symptoms are postprandial. Will obtain US RUQ to evaluate gall bladder.   [place] negative for gall stones. On recheck she remains asymptomatic. Test results were reviewed with the patient using an interpreter. Will start Prilosec and encourage recheck with PCP where she is an established patient. Return precautions discussed.  Final Clinical Impression(s) / ED Diagnoses Final diagnoses:  None   1. Abdominal pain 2. Nausea and vomiting  Rx / DC Orders ED Discharge Orders    None       Charlann Lange, PA-C 02/24/21 0541    Fatima Blank, MD 02/26/21 2111

## 2021-04-04 ENCOUNTER — Ambulatory Visit (INDEPENDENT_AMBULATORY_CARE_PROVIDER_SITE_OTHER): Payer: Self-pay | Admitting: Primary Care

## 2021-07-11 IMAGING — CR DG CHEST 1V PORT
1 series · 1 of 1 positions shown · non-contrast
Comparison: None.

CLINICAL DATA: Chest pain.  Nausea.

EXAM:
PORTABLE CHEST 1 VIEW

[chest pa]
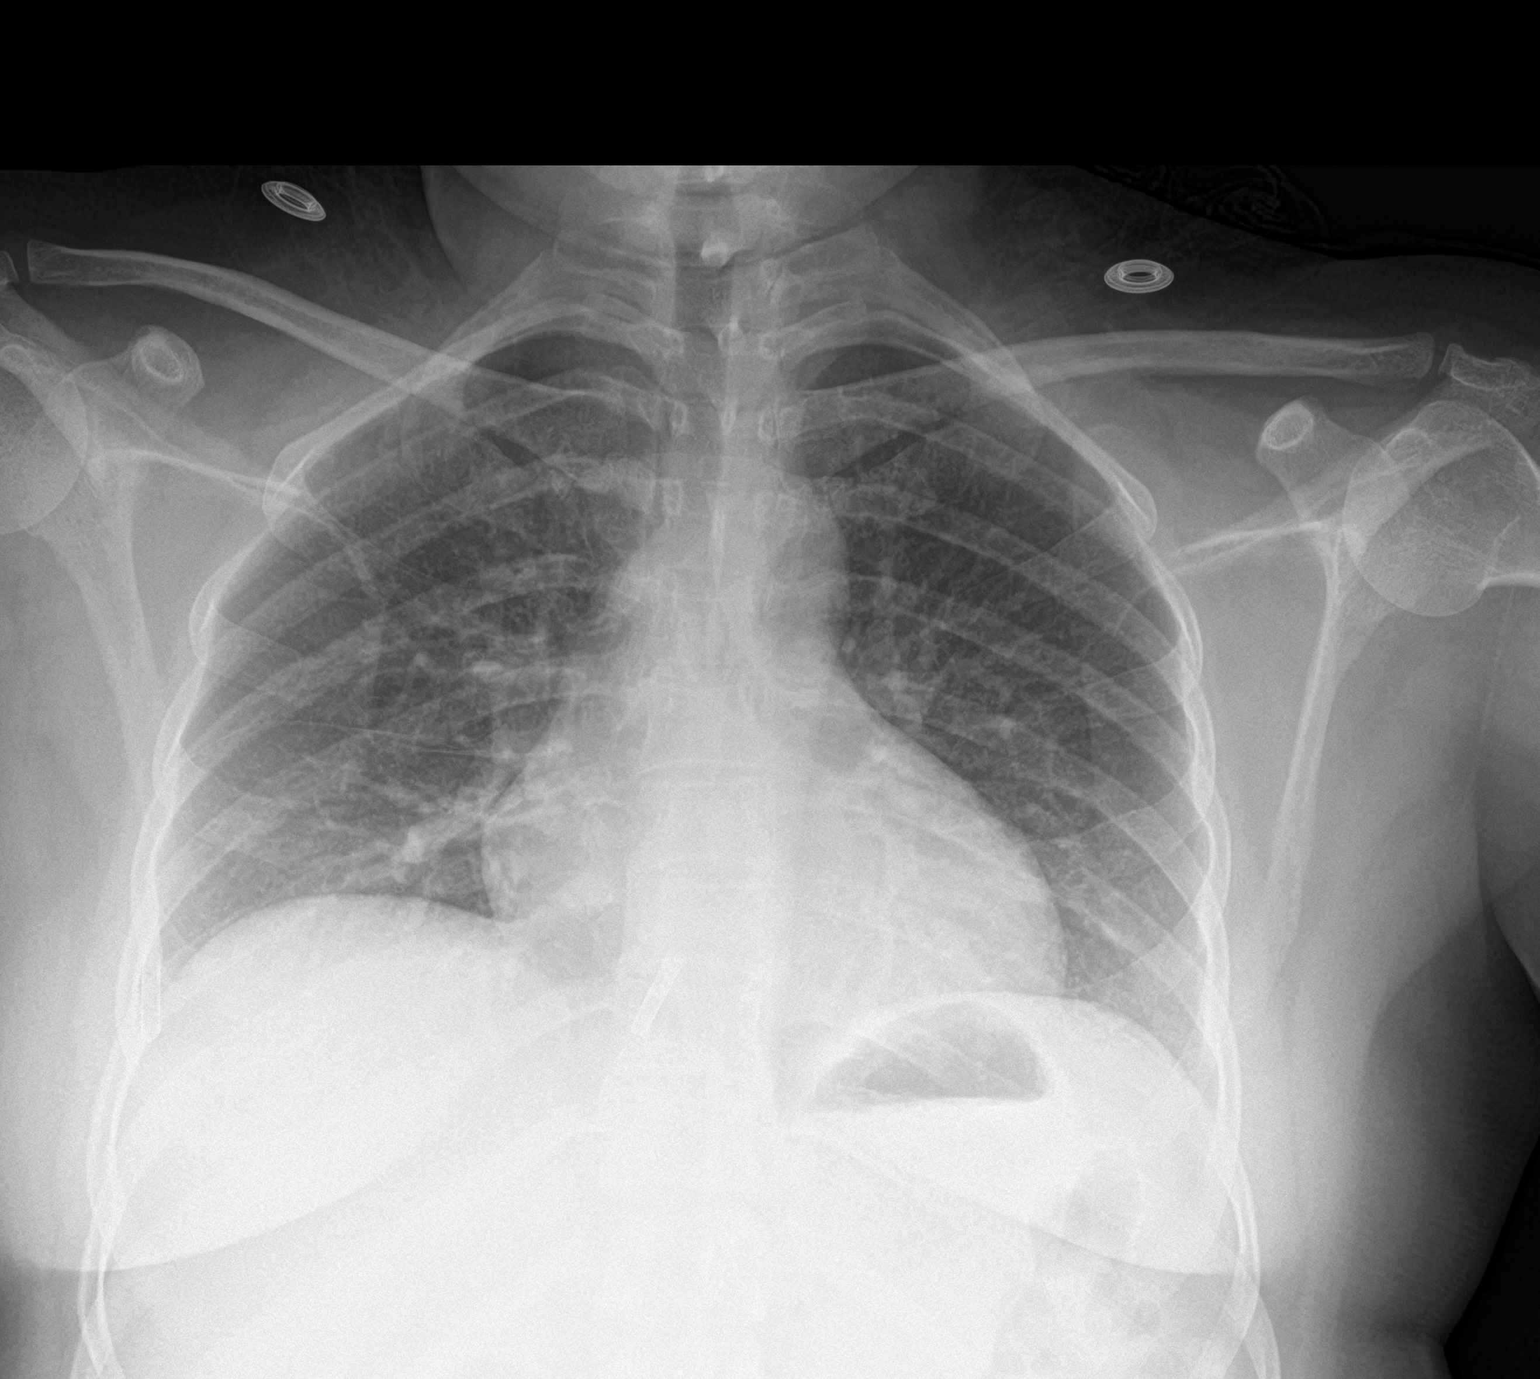

[1 of 1 positions shown; findings below may reference images not displayed]

FINDINGS: Lung volumes are low. Bronchovascular crowding versus bronchial
thickening. Normal heart size and mediastinal contours for
technique. 2.1 cm tubular structure projects over the lower thorax
and right aspect of the cardiac silhouette. Similar tubular density
projects over the right lateral hemithorax. No pleural fluid or
pneumothorax. No acute osseous abnormalities are seen.
IMPRESSION: 1. Low lung volumes with bronchovascular crowding versus bronchial
thickening.
2. Tubular structures project over the central and right lateral
chest, likely external to the patient.

## 2021-07-12 IMAGING — US US ABDOMEN LIMITED RUQ/ASCITES
1 series · 14 of 25 positions shown · non-contrast
Comparison: None.

CLINICAL DATA: Right upper quadrant pain

EXAM:
ULTRASOUND ABDOMEN LIMITED RIGHT UPPER QUADRANT

[Series 1: us abdomen limited · 14 of 49 slices shown]
[im 1/49]
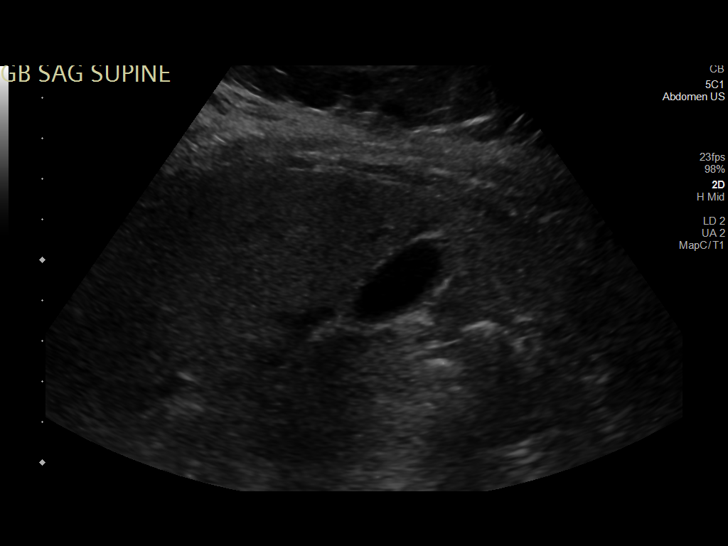
[im 5/49]
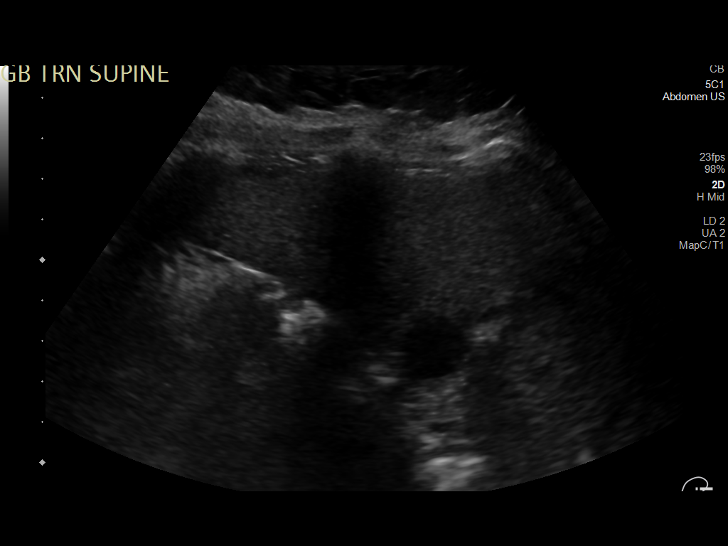
[im 9/49]
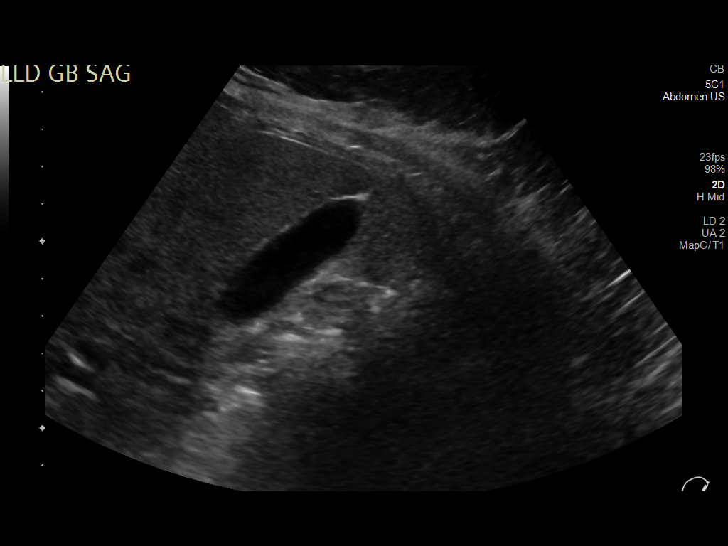
[im 13/49]
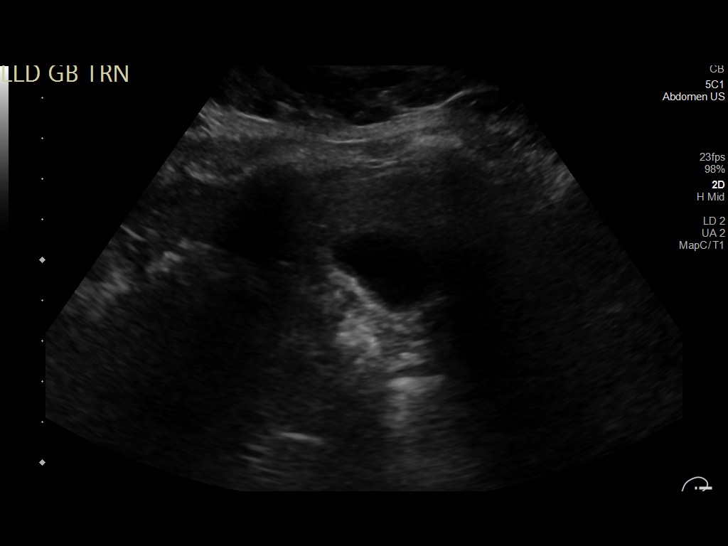
[im 17/49]
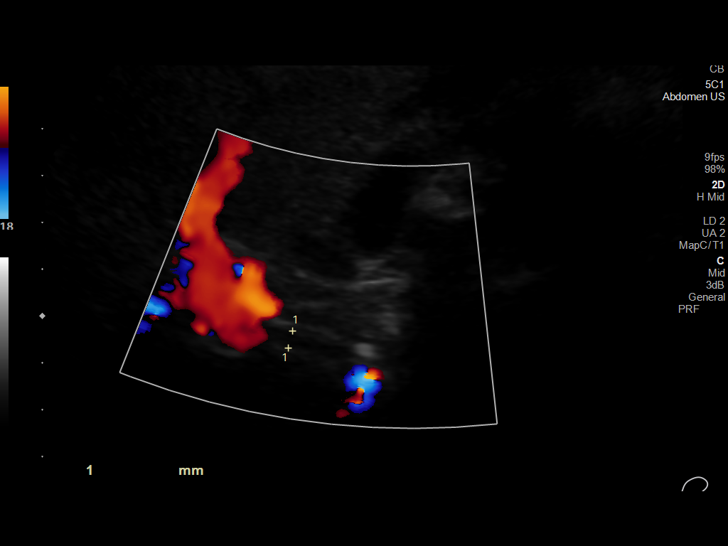
[im 19/49]
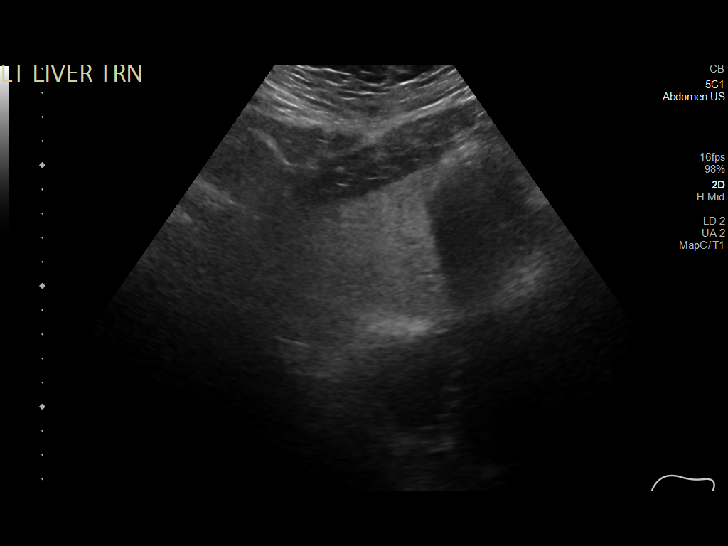
[im 23/49]
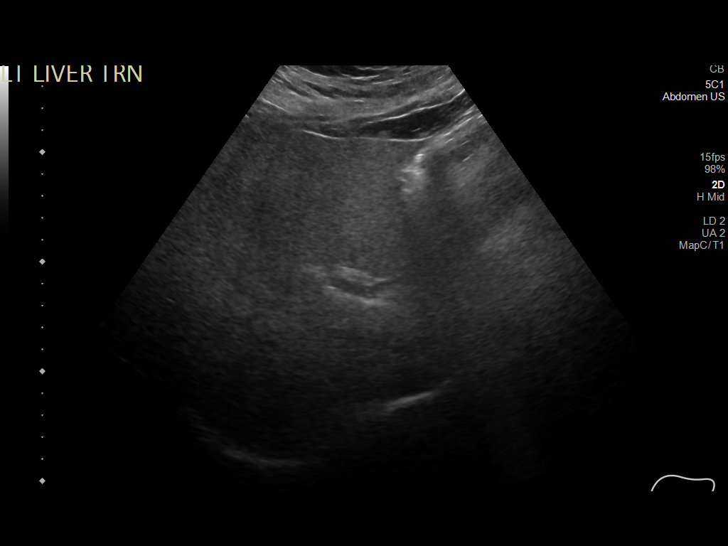
[im 27/49]
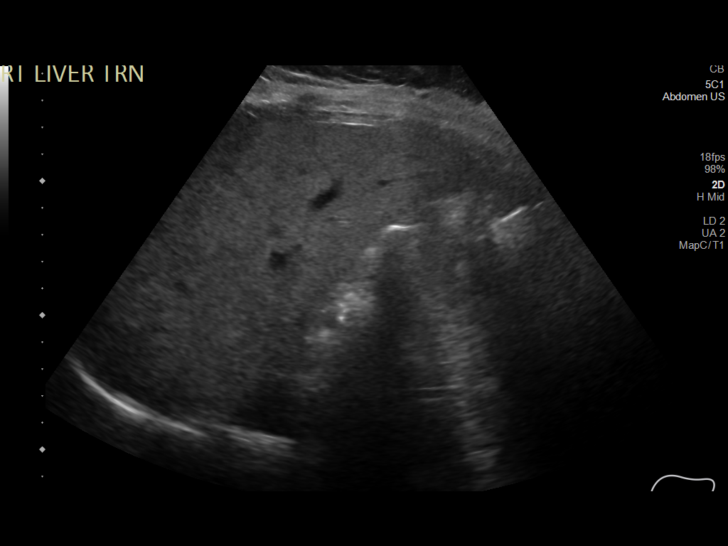
[im 31/49]
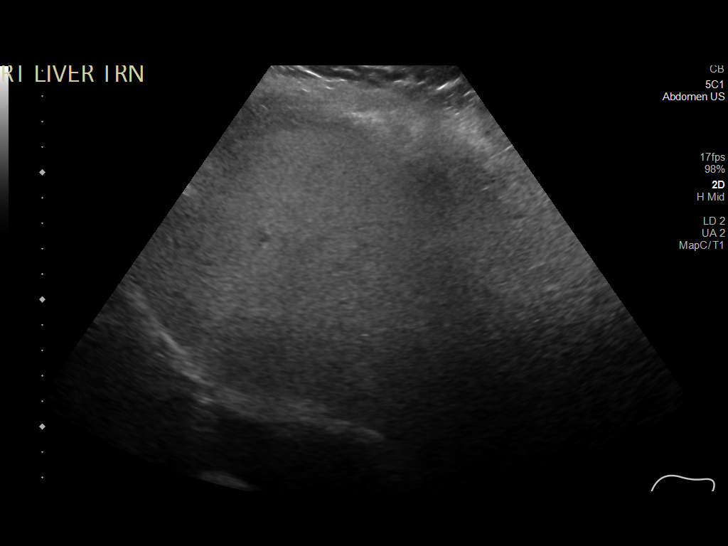
[im 33/49]
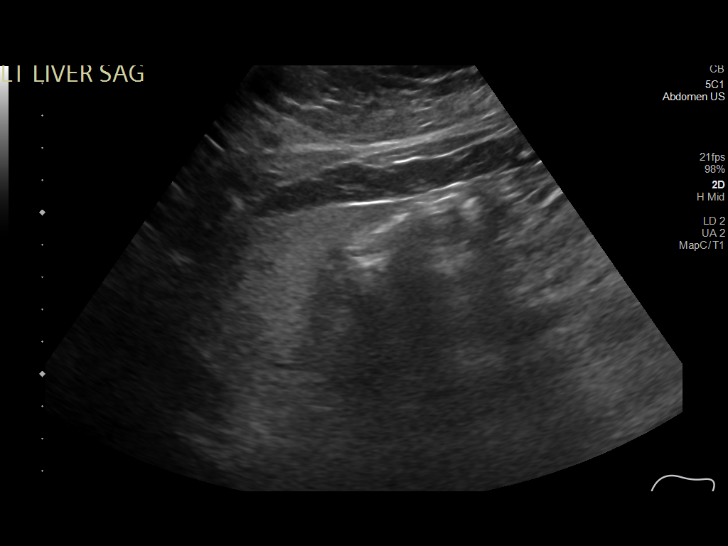
[im 37/49]
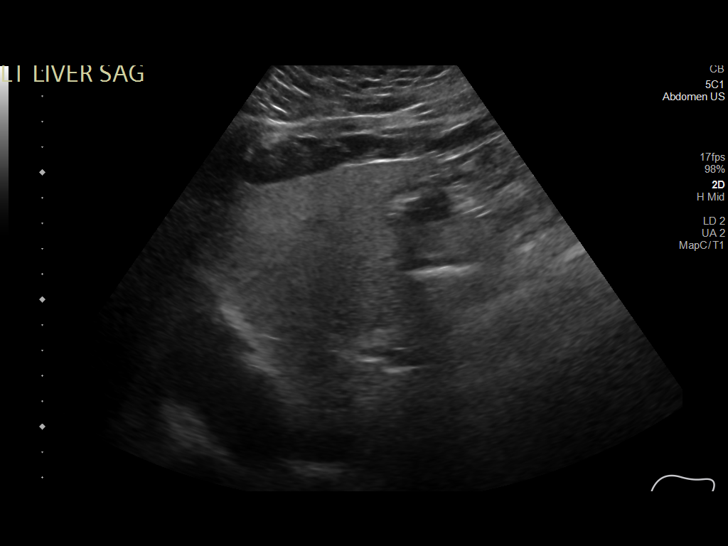
[im 41/49]
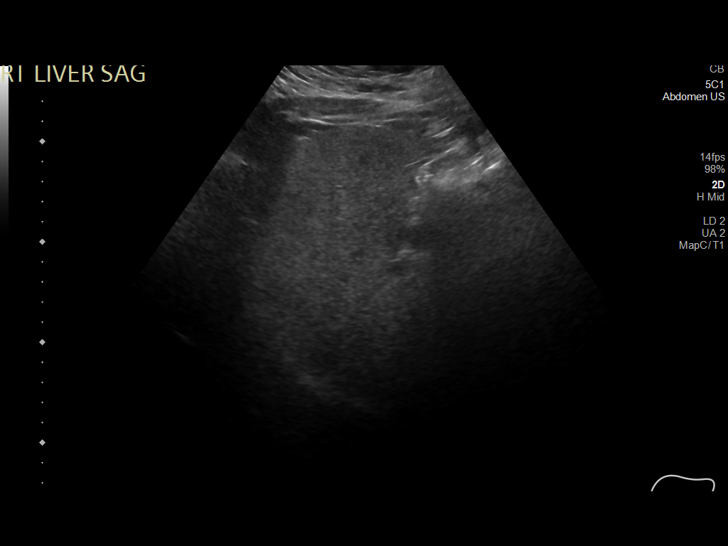
[im 45/49]
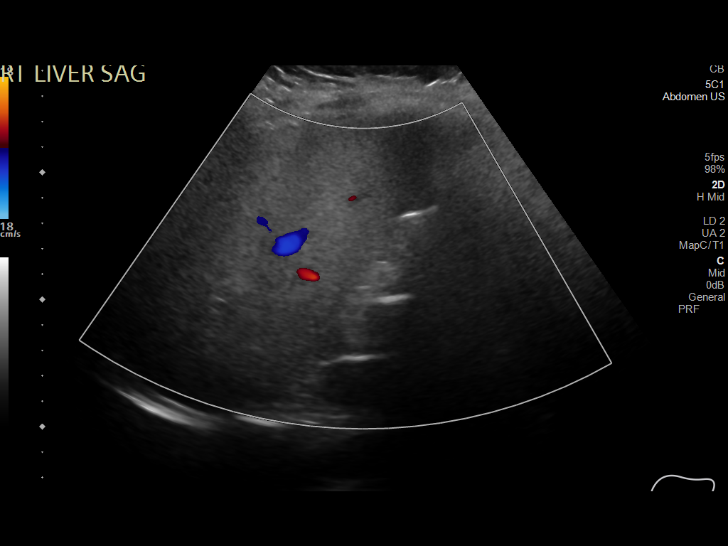
[im 49/49]
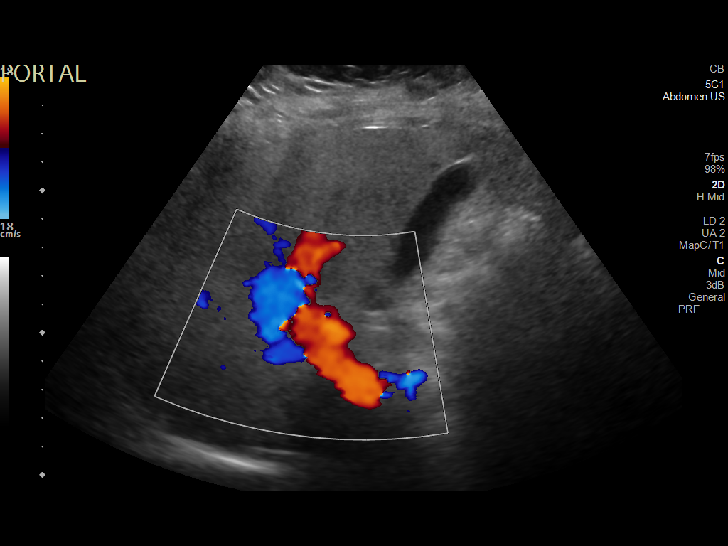

[14 of 25 positions shown; findings below may reference images not displayed]

FINDINGS: Gallbladder:

No gallstones or wall thickening visualized. No sonographic Murphy
sign noted by sonographer.

Common bile duct:

Diameter: 3 mm

Liver:

Echogenic liver with diminished acoustic penetration. No focal
lesion is seen. Portal vein is patent on color Doppler imaging with
normal direction of blood flow towards the liver.
IMPRESSION: 1. Hepatic steatosis.
2. Normal gallbladder.

## 2022-01-10 ENCOUNTER — Other Ambulatory Visit: Payer: Self-pay

## 2022-01-10 ENCOUNTER — Ambulatory Visit (INDEPENDENT_AMBULATORY_CARE_PROVIDER_SITE_OTHER): Payer: Self-pay | Admitting: Primary Care

## 2022-01-10 ENCOUNTER — Encounter (INDEPENDENT_AMBULATORY_CARE_PROVIDER_SITE_OTHER): Payer: Self-pay | Admitting: Primary Care

## 2022-01-10 VITALS — BP 123/79 | HR 72 | Temp 97.8°F | Ht 62.0 in | Wt 168.2 lb

## 2022-01-10 DIAGNOSIS — E119 Type 2 diabetes mellitus without complications: Secondary | ICD-10-CM

## 2022-01-10 LAB — POCT GLYCOSYLATED HEMOGLOBIN (HGB A1C): Hemoglobin A1C: 8.4 % — AB (ref 4.0–5.6)

## 2022-01-10 MED ORDER — OZEMPIC (0.25 OR 0.5 MG/DOSE) 2 MG/1.5ML ~~LOC~~ SOPN
0.2500 mg | PEN_INJECTOR | SUBCUTANEOUS | 0 refills | Status: DC
  Filled 2022-01-10: qty 1.5, 56d supply, fill #0

## 2022-01-10 NOTE — Patient Instructions (Signed)
Semaglutide Injection ??Qu? es este medicamento? ?La SEMAGLUTIDA trata la diabetes tipo 2. Act?a aumentando los niveles de insulina en el cuerpo, lo cual disminuye el az?car en la sangre (glucosa). Tambi?n reduce la cantidad de az?car liberada en la sangre y desacelera la digesti?n. Tambi?n se puede usar para disminuir el riesgo de ataque card?aco y accidente cerebrovascular en personas con diabetes tipo 2. Con frecuencia, este medicamento se combina con cambios en la dieta y el ejercicio. ?Este medicamento puede ser utilizado para otros usos; si tiene alguna pregunta consulte con su proveedor de atenci?n m?dica o con su farmac?utico. ?MARCAS COMUNES: OZEMPIC ??Qu? le debo informar a mi profesional de la salud antes de tomar este medicamento? ?Necesitan saber si usted presenta alguno de los siguientes problemas o situaciones: ?Tumores endocrinos (neoplasia end?crina m?ltiple tipo II) o si alguien en su familiar tuvo esos tumores ?Enfermedad ocular, problemas de la visi?n ?Antecedentes de pancreatitis ?Enfermedad renal ?Problemas estomacales ?C?ncer de tiroides o si alguien en su familia tuvo c?ncer de tiroides ?Una reacci?n al?rgica o inusual a la semaglutida, a otros medicamentos, alimentos, colorantes o conservantes ?Si est? embarazada o buscando quedar embarazada ?Si est? amamantando a un beb? ??C?mo debo utilizar este medicamento? ?Este medicamento se debe inyectar debajo de la piel de la parte superior de la pierna (muslo), del ?rea del est?mago o de la parte superior del brazo. Se administra una vez por semana (cada 7 d?as). Le ense?ar?n c?mo preparar y administrar este medicamento. Use el medicamento exactamente como se le indique. Use su medicamento a intervalos regulares. No lo use con una frecuencia mayor a la indicada. ?Si usa este medicamento con insulina, debe inyectar este medicamento y la insulina por separado. No los mezcle. No se aplique una inyecci?n al lado de la otra. Cambie (rote) los sitios de  inyecci?n con cada inyecci?n. ?Es importante que deseche las agujas y las jeringas usadas en un recipiente resistente a los pinchazos. No las deseche en la basura. Si no tiene un recipiente resistente a los pinchazos, llame a su farmac?utico o a su equipo de atenci?n para obtenerlo. ?Su farmac?utico le dar? una Gu?a del medicamento especial (MedGuide, nombre en ingl?s) con cada receta y en cada ocasi?n que la vuelva a surtir. Aseg?rese de leer esta informaci?n cada vez cuidadosamente. ?Este medicamento viene con INSTRUCCIONES DE USO. P?dale a su farmac?utico que le indique c?mo usar este medicamento. Lea la informaci?n atentamente. Hable con su farmac?utico o su equipo de atenci?n si tiene alguna pregunta. ?Hable con su equipo de atenci?n sobre el uso de este medicamento en ni?os. Puede requerir atenci?n especial. ?Sobredosis: P?ngase en contacto inmediatamente con un centro toxicol?gico o una sala de urgencia si usted cree que haya tomado demasiado medicamento. ?ATENCI?N: Este medicamento es solo para usted. No comparta este medicamento con nadie. ??Qu? sucede si me olvido de una dosis? ?Si se olvida una dosis, t?mela lo antes posible, si es dentro de los 5 d?as despu?s de la fecha en que deb?a tomarla. Luego tome la pr?xima dosis en el horario semanal habitual. Si han pasado m?s de 5 d?as despu?s de olvidarse la dosis, no tome la dosis que se olvid?. Administre la pr?xima dosis a la hora habitual. No se administre dosis adicionales o dobles. Si tiene preguntas sobre una dosis que se olvid?, contacte a su equipo de atenci?n para que le brinde asesoramiento. ??Qu? puede interactuar con este medicamento? ?Otros medicamentos para la diabetes ?Muchos medicamentos pueden causar cambios en el nivel de az?car en la sangre. Estos   incluyen: ?Bebidas con alcohol ?Medicamentos antivirales para el VIH o SIDA ?Aspirina y otros medicamentos tipo aspirina ?Ciertos medicamentos para la presi?n arterial, enfermedad cardiaca y  frecuencia cardiaca irregular ?Cromo ?Diur?ticos ?Hormonas femeninas, tales como estr?genos o progestinas, p?ldoras anticonceptivas ?Fenofibrato ?Gemfibrozil ?Isoniazida ?Lanreotida ?Hormonas masculinas o esteroides anab?licos ?IMAO, tales como Carbex, Eldepryl, Marplan, Nardil y Parnate ?Medicamentos para bajar de peso ?Medicamentos para alergias, asma, resfriados o tos ?Medicamentos para depresi?n, ansiedad o trastornos psic?ticos ?Niacina ?Nicotina ?AINE, medicamentos para el dolor y la inflamaci?n, tales como ibuprofeno o naproxeno ?Octreotida ?Pasireotida ?Pentamidina ?Fenito?na ?Probenecid ?Antibi?ticos del grupo de las quinolonas, tales como ciprofloxacino, levofloxacino y ofloxacino ?Algunos suplementos diet?ticos a base de hierbas ?Medicamentos esteroideos, tales como la prednisona o la cortisona ?Sulfametoxazol; trimetoprima ?Hormonas tiroideas ?Algunos medicamentos pueden ocultar los s?ntomas de advertencia de niveles bajos de az?car en la sangre (hipoglucemia). Es posible que deba monitorear m?s atentamente su nivel de az?car en la sangre si est? tomando uno de estos medicamentos. Estos medicamentos incluyen: ?Betabloqueadores, que con frecuencia se usan para la presi?n arterial alta o problemas cardiacos (algunos ejemplos son atenolol, metoprolol y propranolol) ?Clonidina ?Guanetidina ?Reserpina ?Puede ser que esta lista no menciona todas las posibles interacciones. Informe a su profesional de la salud de todos los productos a base de hierbas, medicamentos de venta libre o suplementos nutritivos que est? tomando. Si usted fuma, consume bebidas alcoh?licas o si utiliza drogas ilegales, ind?queselo tambi?n a su profesional de la salud. Algunas sustancias pueden interactuar con su medicamento. ??A qu? debo estar atento al usar este medicamento? ?Visite a su equipo de atenci?n para que revise su evoluci?n peri?dicamente. ?Beba abundante cantidad de l?quidos mientras usa este medicamento. Consulte con su  equipo de atenci?n si tiene un ataque de diarrea grave, n?useas y v?mitos. La p?rdida de demasiado l?quido corporal puede hacer que sea peligroso usar este medicamento. ?Se monitorizar? una prueba llamada Hemoglobina A1C (A1C). Es un an?lisis de sangre sencillo. Mide su control del nivel de az?car en la sangre durante los ?ltimos 2 a 3 meses. Se le realizar? esta prueba cada 3 a 6 meses. ?Aprenda a revisar su nivel de az?car en la sangre. Conozca los s?ntomas del nivel bajo y alto de az?car en la sangre, y c?mo controlarlos. ?Siempre lleve con usted una fuente r?pida de az?car por si tiene s?ntomas de nivel bajo de az?car en la sangre. Algunos ejemplos incluyen caramelos duros de az?car o tabletas de glucosa. Aseg?rese de que otras personas sepan que usted se puede ahogar si come o bebe cuando presenta s?ntomas graves de nivel bajo de az?car en la sangre, como convulsiones o p?rdida del conocimiento. Deben obtener ayuda m?dica de inmediato. ?Informe a su equipo de atenci?n si tiene niveles altos de az?car en la sangre. Es posible que deba cambiar la dosis de su medicamento. Si est? enfermo o hace m?s ejercicio que lo habitual, es posible que necesite cambiar la dosis de su medicamento. ?No saltee comidas. Pregunte a su equipo de atenci?n si debe evitar el alcohol. Muchos productos de venta libre para la tos y el resfriado contienen az?car o alcohol. Estos pueden afectar los niveles de az?car en la sangre. ?Los inyectores nunca deben compartirse. Incluso si se cambia la aguja, al compartir se pueden contagiar virus como la hepatitis o el VIH. ?Use un brazalete o una cadena de identificaci?n m?dica, y lleve con usted una tarjeta que describa su enfermedad, detalles de su medicamento y horario de dosis. ?No debe quedar embarazada mientras est? tomando este medicamento. Las   mujeres deben informar a su equipo de atenci?n si est?n buscando quedar embarazadas o si creen que podr?an estar embarazadas. Existe la posibilidad  de efectos secundarios graves en un beb? sin nacer. Para obtener m?s informaci?n, hable con su equipo de atenci?n. ??Qu? efectos secundarios puedo tener al utilizar este medicamento? ?Efectos secundarios que

## 2022-01-10 NOTE — Progress Notes (Signed)
? ?Subjective:  ?Patient ID: Lindsay Hunt, female    DOB: Feb 12, 1986  Age: 36 y.o. MRN: [id number] ? ?CC: Diabetes  ? ? ?HPI ?Lindsay Hunt is a 36 year old Hispanic female ( interpreter Johnston Ebbs [phone]) presents for follow-up of diabetes. Patient does check blood sugar at home ? ?Compliant with meds - Yes ?Checking CBGs? Yes ? Fasting avg - 300 ? Postprandial average -  ?Exercising regularly? - No ?Watching carbohydrate intake? - No 3-4 tortilla daily  ?Neuropathy ? -yes feels like something poking Lindsay Hunt ?Hypoglycemic events - No ? - Recovers with :  ? ?Pertinent ROS:  ?Polyuria - Yes ?Polydipsia - No ?Vision problems - No ? ?Medications as noted below. Taking them regularly without complication/adverse reaction being reported today.  ? ?History ?Lindsay Hunt has a past medical history of Diabetes mellitus without complication ([place]), Hyperlipidemia, and Hypertension.  ? ?Lindsay Hunt has a past surgical history that includes No past surgeries.  ? ?Lindsay Hunt family history includes Cancer in Lindsay Hunt maternal grandmother and mother; Diabetes in Lindsay Hunt brother, father, maternal grandfather, maternal grandmother, mother, paternal grandfather, paternal grandmother, and sister.Lindsay Hunt reports that Lindsay Hunt has never smoked. Lindsay Hunt has never used smokeless tobacco. Lindsay Hunt reports that Lindsay Hunt does not drink alcohol and does not use drugs. ? ?Current Outpatient Medications on File Prior to Visit  ?Medication Sig Dispense Refill  ? blood glucose meter kit and supplies KIT Dispense based on patient and insurance preference. Use up to four times daily as directed. (FOR ICD-9 250.00, 250.01). 1 each 0  ? ?No current facility-administered medications on file prior to visit.  ? ? ?ROS ?Comprehensive ROS Pertinent positive and negative noted in HPI   ? ?Objective:  ?BP 123/79 (BP Location: Right Arm, Patient Position: Sitting, Cuff Size: Normal)   Pulse 72   Temp 97.8 ?F (36.6 ?C) (Oral)   Ht _0  (1.575 m)   Wt 168 lb 3.2 oz (76.3 kg)   SpO2 96%   BMI 30.76  kg/m?  ? ?BP Readings from Last 3 Encounters:  ?01/10/22 123/79  ?02/24/21 103/71  ?01/02/21 113/82  ? ? ?Wt Readings from Last 3 Encounters:  ?01/10/22 168 lb 3.2 oz (76.3 kg)  ?02/23/21 166 lb 14.2 oz (75.7 kg)  ?01/02/21 166 lb 12.8 oz (75.7 kg)  ? ? ?Physical Exam ?Vitals reviewed.  ?Constitutional:   ?   Appearance: Lindsay Hunt is obese.  ?HENT:  ?   Head: Normocephalic.  ?   Right Ear: Tympanic membrane normal.  ?   Left Ear: Tympanic membrane normal.  ?   Nose: Nose normal.  ?Eyes:  ?   Extraocular Movements: Extraocular movements intact.  ?   Pupils: Pupils are equal, round, and reactive to light.  ?Cardiovascular:  ?   Rate and Rhythm: Normal rate and regular rhythm.  ?Pulmonary:  ?   Effort: Pulmonary effort is normal.  ?   Breath sounds: Normal breath sounds.  ?Abdominal:  ?   General: Bowel sounds are normal.  ?   Palpations: Abdomen is soft.  ?Musculoskeletal:     ?   General: Normal range of motion.  ?   Cervical back: Normal range of motion and neck supple.  ?Skin: ?   General: Skin is warm and dry.  ?Neurological:  ?   Mental Status: Lindsay Hunt is alert and oriented to person, place, and time.  ?Psychiatric:     ?   Mood and Affect: Mood normal.     ?   Behavior: Behavior normal.     ?  Thought Content: Thought content normal.     ?   Judgment: Judgment normal.  ? ?Lab Results  ?Component Value Date  ? HGBA1C 8.4 (A) 01/10/2022  ? HGBA1C 6.7 (A) 01/02/2021  ? HGBA1C 6.3 (A) 07/05/2020  ? ? ?Lab Results  ?Component Value Date  ? WBC 8.4 02/23/2021  ? HGB 11.9 (L) 02/23/2021  ? HCT 35.6 (L) 02/23/2021  ? PLT 268 02/23/2021  ? GLUCOSE 121 (H) 02/23/2021  ? CHOL 203 (H) 01/02/2021  ? TRIG 135 01/02/2021  ? HDL 42 01/02/2021  ? LDLCALC 137 (H) 01/02/2021  ? ALT 78 (H) 02/23/2021  ? AST 47 (H) 02/23/2021  ? NA 135 02/23/2021  ? K 3.5 02/23/2021  ? CL 106 02/23/2021  ? CREATININE 0.59 02/23/2021  ? BUN 11 02/23/2021  ? CO2 23 02/23/2021  ? TSH 3.270 12/03/2018  ? HGBA1C 8.4 (A) 01/10/2022  ? ? ? ?Assessment & Plan:   ? ?Diagnoses and all orders for this visit: ? ?Type 2 diabetes mellitus without complication, without long-term current use of insulin ([place]) ?-     HgB A1c 8.4 metformin cause GI upset everyday but continue to take. Discontinue metformin added Ozempic weekly f/u with clinical pharmacist 4 weeks. ?Monitor foods that are high in carbohydrates are the following rice, potatoes, breads, tortilla,sugars, and pastas.  Reduction in the intake (eating) will assist in lowering your blood sugars.  ?     CBC with Differential; Future ?-     CMP14+EGFR; Future ?-     Lipid Panel; Future ?-     Ambulatory referral to Ophthalmology ?-     Microalbumin, urine ? ?Class 2 severe obesity due to excess calories with serious comorbidity in adult, unspecified BMI ([place]) ?Obesity is 30-39 indicating an excess in caloric intake or underlining conditions. This may lead to other co-morbidities. Lifestyle modifications of diet and exercise may reduce obesity.   ? ?Other orders ?-     Semaglutide,0.25 or 0.5MG/DOS, (OZEMPIC, 0.25 OR 0.5 MG/DOSE,) 2 MG/1.5ML SOPN; Inject 0.25 mg into the skin once a week. ? ?  ?I have discontinued Moyinoluwa Plata Patricio's pravastatin, metFORMIN, famotidine, famotidine, metFORMIN, pravastatin, omeprazole, and ondansetron. I am also having Lindsay Hunt start on Ozempic (0.25 or 0.5 MG/DOSE). Additionally, I am having Lindsay Hunt maintain Lindsay Hunt blood glucose meter kit and supplies. ? ?Meds ordered this encounter  ?Medications  ? Semaglutide,0.25 or 0.5MG/DOS, (OZEMPIC, 0.25 OR 0.5 MG/DOSE,) 2 MG/1.5ML SOPN  ?  Sig: Inject 0.25 mg into the skin once a week.  ?  Dispense:  1.5 mL  ?  Refill:  0  ? ? ? ?Follow-up:  ? ?Return in about 3 months (around 04/12/2022) for DM. ? ?The above assessment and management plan was discussed with the patient. The patient verbalized understanding of and has agreed to the management plan. Patient is aware to call the clinic if symptoms fail to improve or worsen. Patient is aware when to return to the  clinic for a follow-up visit. Patient educated on when it is appropriate to go to the emergency department.  ? ?Juluis Mire, NP-C ? ?  ?

## 2022-01-11 ENCOUNTER — Other Ambulatory Visit: Payer: Self-pay | Admitting: Pharmacist

## 2022-01-11 ENCOUNTER — Other Ambulatory Visit: Payer: Self-pay

## 2022-01-11 MED ORDER — TRULICITY 0.75 MG/0.5ML ~~LOC~~ SOAJ
0.7500 mg | SUBCUTANEOUS | 1 refills | Status: DC
  Filled 2022-01-11: qty 2, 28d supply, fill #0
  Filled 2022-02-08: qty 2, 28d supply, fill #1

## 2022-02-07 ENCOUNTER — Ambulatory Visit: Payer: Self-pay | Admitting: Pharmacist

## 2022-02-08 ENCOUNTER — Other Ambulatory Visit: Payer: Self-pay

## 2022-04-12 ENCOUNTER — Encounter (INDEPENDENT_AMBULATORY_CARE_PROVIDER_SITE_OTHER): Payer: Self-pay | Admitting: Primary Care

## 2022-04-12 ENCOUNTER — Ambulatory Visit (INDEPENDENT_AMBULATORY_CARE_PROVIDER_SITE_OTHER): Payer: Self-pay | Admitting: Primary Care

## 2022-04-12 ENCOUNTER — Other Ambulatory Visit: Payer: Self-pay

## 2022-04-12 VITALS — BP 121/79 | HR 70 | Temp 98.0°F | Ht 62.0 in | Wt 166.2 lb

## 2022-04-12 DIAGNOSIS — E6609 Other obesity due to excess calories: Secondary | ICD-10-CM

## 2022-04-12 DIAGNOSIS — Z760 Encounter for issue of repeat prescription: Secondary | ICD-10-CM

## 2022-04-12 DIAGNOSIS — Z6830 Body mass index (BMI) 30.0-30.9, adult: Secondary | ICD-10-CM

## 2022-04-12 DIAGNOSIS — E782 Mixed hyperlipidemia: Secondary | ICD-10-CM

## 2022-04-12 DIAGNOSIS — E119 Type 2 diabetes mellitus without complications: Secondary | ICD-10-CM

## 2022-04-12 LAB — POCT GLYCOSYLATED HEMOGLOBIN (HGB A1C): Hemoglobin A1C: 6.6 % — AB (ref 4.0–5.6)

## 2022-04-12 MED ORDER — TRULICITY 0.75 MG/0.5ML ~~LOC~~ SOAJ
0.7500 mg | SUBCUTANEOUS | 1 refills | Status: DC
  Filled 2022-04-12 – 2022-04-19 (×2): qty 2, 28d supply, fill #0
  Filled 2022-04-20 – 2022-05-24 (×2): qty 2, 28d supply, fill #1

## 2022-04-12 MED ORDER — LISINOPRIL 2.5 MG PO TABS
ORAL_TABLET | ORAL | 0 refills | Status: DC
  Filled 2022-04-12 – 2022-04-20 (×2): qty 90, 90d supply, fill #0
  Filled 2022-04-22: qty 90, 45d supply, fill #0

## 2022-04-12 NOTE — Progress Notes (Signed)
[place], is a 36 y.o. female  YTK:[id number]  FTD:[id number]  DOB - 08-30-1986  Chief Complaint  Patient presents with   Diabetes       Subjective:   [name] is a 36 y.o. Hispanic female (interpreter Dannielle Huh [phone])   here today for a follow up visit.The management of Type 2 diabetes she reports fasting.  Patient denies polyuria ,polydipsia, polyphagia or vision changes.  Patient has No headache, No chest pain, No abdominal pain - No Nausea, No new weakness tingling or numbness, No Cough - shortness of breath. No problems taking Trulicity weekly. She has not notice any change in weight. Checks her fasting blood sugars 130-160. Denies hypoglycemia symptoms.  No problems updated.  No Known Allergies  Past Medical History:  Diagnosis Date   Diabetes mellitus without complication ([place])    Hyperlipidemia    Hypertension     Current Outpatient Medications on File Prior to Visit  Medication Sig Dispense Refill   blood glucose meter kit and supplies KIT Dispense based on patient and insurance preference. Use up to four times daily as directed. (FOR ICD-9 250.00, 250.01). 1 each 0   No current facility-administered medications on file prior to visit.    Objective:   Vitals:   04/12/22 0921  BP: 121/79  Pulse: 70  Temp: 98 F (36.7 C)  TempSrc: Oral  SpO2: 97%  Weight: 166 lb 3.2 oz (75.4 kg)  Height: $Remove'5\' 2"'ExDKhMG$  (1.575 m)    Exam General appearance : Awake, alert,obese female, not in any distress. Speech Clear. Not toxic looking HEENT: Atraumatic and Normocephalic, pupils equally reactive to light and accomodation Neck: Supple, no JVD. No cervical lymphadenopathy.  Chest: Good air entry bilaterally, no added sounds  CVS: S1 S2 regular, no murmurs.  Abdomen: Bowel sounds present, Non tender and not distended with no gaurding, rigidity or rebound. Extremities: B/L Lower Ext shows no edema, both legs are warm to  touch Neurology: Awake alert, and oriented X 3, Non focal Skin: No Rash  Data Review Lab Results  Component Value Date   HGBA1C 6.6 (A) 04/12/2022   HGBA1C 8.4 (A) 01/10/2022   HGBA1C 6.7 (A) 01/02/2021    Assessment & Plan  Lindsay Hunt was seen today for diabetes.  Diagnoses and all orders for this visit:  Type 2 diabetes mellitus without complication, without long-term current use of insulin ([facility]) -     HgB A1c 6.6  Continue foods that are high in carbohydrates are the following rice, potatoes, breads, tortillas, sugars, and pastas.  Reduction in the intake (eating) will assist in lowering your blood sugars.  -     Dulaglutide (TRULICITY) 2.83 TD/1.7OH SOPN; Inject 0.75 mg into the skin once a week. -     lisinopril (ZESTRIL) 2.5 MG tablet; [place]  Comprehensive diabetic foot examination, type 2 DM, encounter for [facility]) Completed   Mixed hyperlipidemia  Healthy lifestyle diet of fruits vegetables fish nuts whole grains and low saturated fat . Foods high in cholesterol or liver, fatty meats,cheese, butter avocados, nuts and seeds, chocolate and fried foods. Li[pids   Class 1 obesity due to excess calories with serious comorbidity and body mass index (BMI) of 30.0 to 30.9 in adult Obesity is 30-39 indicating an excess in caloric intake or underlining conditions. This may lead to other co-morbidities. Lifestyle modifications of diet and exercise may reduce obesity.    Medication refill -  Dulaglutide (TRULICITY) 3.97 QB/3.4LP SOPN; Inject 0.75 mg into the skin once a week.   Patient have been counseled extensively about nutrition and exercise. Other issues discussed during this visit include: low cholesterol diet, weight control and daily exercise, foot care, annual eye examinations at Ophthalmology, importance of adherence with medications and regular follow-up. We also discussed long term complications of uncontrolled diabetes and hypertension.   Return in  about 3 months (around 07/13/2022) for DM.  The patient was given clear instructions to go to ER or return to medical center if symptoms don't improve, worsen or new problems develop. The patient verbalized understanding. The patient was told to call to get lab results if they haven't heard anything in the next week.   This note has been created with Surveyor, quantity. Any transcriptional errors are unintentional.   Kerin Perna, NP 04/12/2022, 10:32 AM

## 2022-04-12 NOTE — Patient Instructions (Signed)
Recuento de caloras para bajar de peso Calorie Counting for Weight Loss Las caloras son unidades de energa. El cuerpo necesita una cierta cantidad de caloras de los alimentos para que lo ayuden a funcionar durante todo el da. Cuando se comen o beben ms caloras de las que el cuerpo necesita, este acumula las caloras adicionales mayormente como grasa. Cuando se comen o beben menos caloras de las que el cuerpo necesita, este quema grasa para obtener la energa que necesita. El recuento de caloras es el registro de la cantidad de caloras que se comen y beben cada da. El recuento de caloras puede ser de ayuda si necesita perder peso. Si come menos caloras de las que el cuerpo necesita, debera bajar de peso. Pregntele al mdico cul es un peso sano para usted. Para que el recuento de caloras funcione, usted tendr que ingerir la cantidad de caloras adecuadas cada da, para bajar una cantidad de peso saludable por semana. Un nutricionista puede ayudar a determinar la cantidad de caloras que usted necesita por da y sugerirle formas de alcanzar su objetivo calrico. Una cantidad de peso saludable para bajar cada semana suele ser entre 1 y 2 libras (0.5 a 0.9 kg). Esto habitualmente significa que su ingesta diaria de caloras se debera reducir en unas 500 a 750 caloras. Ingerir de 1200 a 1500 caloras por da puede ayudar a la mayora de las mujeres a bajar de peso. Ingerir de 1500 a 1800 caloras por da puede ayudar a la mayora de los hombres a bajar de peso. Qu debo saber acerca del recuento de caloras? Trabaje con el mdico o el nutricionista para determinar cuntas caloras debe recibir cada da. A fin de alcanzar su objetivo diario de caloras, tendr que: Averiguar cuntas caloras hay en cada alimento que le gustara comer. Intente hacerlo antes de comer. Decidir la cantidad que puede comer del alimento. Llevar un registro de los alimentos. Para esto, anote lo que comi y cuntas  caloras tena. Para perder peso con xito, es importante equilibrar el recuento de caloras con un estilo de vida saludable que incluya actividad fsica de forma regular. Dnde encuentro informacin sobre las caloras?  Es posible encontrar la cantidad de caloras que contiene un alimento en la etiqueta de informacin nutricional. Si un alimento no tiene una etiqueta de informacin nutricional, intente buscar las caloras en Internet o pida ayuda al nutricionista. Recuerde que las caloras se calculan por porcin. Si opta por comer ms de una porcin de un alimento, tendr que multiplicar las caloras de una porcin por la cantidad de porciones que planea comer. Por ejemplo, la etiqueta de un envase de pan puede decir que el tamao de una porcin es 1 rodaja, y que una porcin tiene 90 caloras. Si come 1 rodaja, habr comido 90 caloras. Si come 2 rodajas, habr comido 180 caloras. Cmo llevo un registro de comidas? Despus de cada vez que coma, anote lo siguiente en el registro de alimentos lo antes posible: Lo que comi. Asegrese de incluir los aderezos, las salsas y otros extras en los alimentos. La cantidad que comi. Esto se puede medir en tazas, onzas o cantidad de alimentos. Cuntas caloras haba en cada alimento y en cada bebida. La cantidad total de caloras en la comida que tom. Tenga a mano el registro de alimentos, por ejemplo, en un anotador de bolsillo o utilice una aplicacin o sitio web en el telfono mvil. Algunos programas calcularn las caloras por usted y le mostrarn la cantidad de   caloras que le quedan para llegar al objetivo diario. Cules son algunos consejos para controlar las porciones? Sepa cuntas caloras hay en una porcin. Esto lo ayudar a saber cuntas porciones de un alimento determinado puede comer. Use una taza medidora para medir los tamaos de las porciones. Tambin puede intentar pesar las porciones en una balanza de cocina. Con el tiempo, podr hacer  un clculo estimativo de los tamaos de las porciones de algunos alimentos. Dedique tiempo a poner porciones de diferentes alimentos en sus platos, tazones y tazas predilectos, a fin de saber cmo se ve una porcin. Intente no comer directamente de un envase de alimentos, por ejemplo, de una bolsa o una caja. Comer directamente del envase dificulta ver cunto est comiendo y puede conducir a comer en exceso. Ponga la cantidad que le gustara comer en una taza o un plato, a fin de asegurarse de que est comiendo la porcin correcta. Use platos, vasos y tazones ms pequeos para medir porciones ms pequeas y evitar no comer en exceso. Intente no realizar varias tareas al mismo tiempo. Por ejemplo, evite mirar televisin o usar la computadora mientras come. Si es la hora de comer, sintese a la mesa y disfrute de la comida. Esto lo ayudar a reconocer cundo est satisfecho. Tambin le permitir estar ms consciente de qu come y cunto come. Consejos para seguir este plan Al leer las etiquetas de los alimentos Controle el recuento de caloras en comparacin con el tamao de la porcin. El tamao de la porcin puede ser ms pequeo de lo que suele comer. Verifique la fuente de las caloras. Intente elegir alimentos ricos en protenas, fibras y vitaminas, y bajos en grasas saturadas, grasas trans y sodio. Al ir de compras Lea las etiquetas nutricionales cuando compre. Esto lo ayudar a tomar decisiones saludables sobre qu alimentos comprar. Preste atencin a las etiquetas nutricionales de alimentos bajos en grasas o sin grasas. Estos alimentos a veces tienen la misma cantidad de caloras o ms caloras que las versiones ricas en grasas. Con frecuencia, tambin tienen agregados de azcar, almidn o sal, para darles el sabor que fue eliminado con las grasas. Haga una lista de compras con los alimentos que tienen un menor contenido de caloras y resptela. Al cocinar Intente cocinar sus alimentos preferidos  de una manera ms saludable. Por ejemplo, pruebe hornear en vez de frer. Utilice productos lcteos descremados. Planificacin de las comidas Utilice ms frutas y verduras. La mitad de su plato debe ser de frutas y verduras. Incluya protenas magras, como pollo, pavo y pescado. Estilo de vida Cada semana, trate de hacer una de las siguientes cosas: 150 minutos de ejercicio moderado, como caminar. 75 minutos de ejercicio enrgico, como correr. Informacin general Sepa cuntas caloras tienen los alimentos que come con ms frecuencia. Esto le ayudar a contar las caloras ms rpidamente. Encuentre un mtodo para controlar las caloras que funcione para usted. Sea creativo. Pruebe aplicaciones o programas distintos, si llevar un registro de las caloras no funciona para usted. Qu alimentos debo consumir?  Consuma alimentos nutritivos. Es mejor comer un alimento nutritivo, de alto contenido calrico, como un aguacate, que uno con pocos nutrientes, como una bolsa de patatas fritas. Use sus caloras en alimentos y bebidas que lo sacien y no lo dejen con apetito apenas termina de comer. Ejemplos de alimentos que lo sacian son los frutos secos y mantequillas de frutos secos, verduras, protenas magras y alimentos con alto contenido de fibra como los cereales integrales. Los alimentos con alto   contenido de fibra son aquellos que tienen ms de 5 g de fibra por porcin. Preste atencin a las caloras en las bebidas. Las bebidas de bajas caloras incluyen agua y refrescos sin azcar. Es posible que los productos que se enumeran ms arriba no constituyan una lista completa de los alimentos y las bebidas que puede tomar. Consulte a un nutricionista para obtener ms informacin. Qu alimentos debo limitar? Limite el consumo de alimentos o bebidas que no sean buenas fuentes de vitaminas, minerales o protenas, o que tengan alto contenido de grasas no saludables. Estos incluyen: Caramelos. Otros  dulces. Refrescos, bebidas con caf especiales, alcohol y jugo. Es posible que los productos que se enumeran ms arriba no constituyan una lista completa de los alimentos y las bebidas que debe evitar. Consulte a un nutricionista para obtener ms informacin. Cmo puedo hacer el recuento de caloras cuando como afuera? Preste atencin a las porciones. A menudo, las porciones son mucho ms grandes al comer afuera. Pruebe con estos consejos para mantener las porciones ms pequeas: Considere la posibilidad de compartir una comida en lugar de tomarla toda usted solo. Si pide su propia comida, coma solo la mitad. Antes de empezar a comer, pida un recipiente y ponga la mitad de la comida en l. Cuando sea posible, considere la posibilidad de pedir porciones ms pequeas del men en lugar de porciones completas. Preste atencin a la eleccin de alimentos y bebidas. Saber la forma en que se cocinan los alimentos y lo que incluye la comida puede ayudarlo a ingerir menos caloras. Si se detallan las caloras en el men, elija las opciones que contengan la menor cantidad. Elija platos que incluyan verduras, frutas, cereales integrales, productos lcteos con bajo contenido de grasa y protenas magras. Opte por los alimentos hervidos, asados, cocidos a la parrilla o al vapor. Evite los alimentos a los que se les ponga mantequilla, que estn empanados o fritos, o que se sirvan con salsa a base de crema. Generalmente, los alimentos que se etiquetan como "crujientes" estn fritos, a menos que se indique lo contrario. Elija el agua, la leche descremada, el t helado sin azcar u otras bebidas que no contengan azcares agregados. Si desea una bebida alcohlica, escoja una opcin con menos caloras, como una copa de vino o una cerveza ligera. Ordene los aderezos, las salsas y los jarabes aparte. Estos son, con frecuencia, de alto contenido en caloras, por lo que debe limitar la cantidad que ingiere. Si desea una  ensalada, elija una de hortalizas y pida carnes a la parrilla. Evite las guarniciones adicionales como el tocino, el queso o los alimentos fritos. Ordene el aderezo aparte o pida aceite de oliva y vinagre o limn para aderezar. Haga un clculo estimativo de la cantidad de porciones que le sirven. Conocer el tamao de las porciones lo ayudar a estar atento a la cantidad de comida que come en los restaurantes. Dnde buscar ms informacin Centers for Disease Control and Prevention (Centros para el Control y la Prevencin de Enfermedades): www.cdc.gov U.S. Department of Agriculture (Departamento de Agricultura de los EE. UU.): myplate.gov Resumen El recuento de caloras es el registro de la cantidad de caloras que se comen y beben cada da. Si come menos caloras de las que el cuerpo necesita, debera bajar de peso. Una cantidad de peso saludable para bajar por semana suele ser entre 1 y 2 libras (0.5 a 0.9 kg). Esto significa, con frecuencia, reducir su ingesta diaria de caloras unas 500 a 750 caloras. Es posible   encontrar la cantidad de caloras que contiene un alimento en la etiqueta de informacin nutricional. Si un alimento no tiene una etiqueta de informacin nutricional, intente buscar las caloras en Internet o pida ayuda al nutricionista. Use platos, vasos y tazones ms pequeos para medir porciones ms pequeas y evitar no comer en exceso. Use sus caloras en alimentos y bebidas que lo sacien y no lo dejen con apetito poco tiempo despus de haber comido. Esta informacin no tiene como fin reemplazar el consejo del mdico. Asegrese de hacerle al mdico cualquier pregunta que tenga. Document Revised: 01/24/2020 Document Reviewed: 01/24/2020 Elsevier Patient Education  2023 Elsevier Inc.  

## 2022-04-13 LAB — CMP14+EGFR
ALT: 72 IU/L — ABNORMAL HIGH (ref 0–32)
AST: 40 IU/L (ref 0–40)
Albumin/Globulin Ratio: 1.5 (ref 1.2–2.2)
Albumin: 4.4 g/dL (ref 3.8–4.8)
Alkaline Phosphatase: 69 IU/L (ref 44–121)
BUN/Creatinine Ratio: 15 (ref 9–23)
BUN: 8 mg/dL (ref 6–20)
Bilirubin Total: 0.5 mg/dL (ref 0.0–1.2)
CO2: 23 mmol/L (ref 20–29)
Calcium: 9.4 mg/dL (ref 8.7–10.2)
Chloride: 103 mmol/L (ref 96–106)
Creatinine, Ser: 0.54 mg/dL — ABNORMAL LOW (ref 0.57–1.00)
Globulin, Total: 2.9 g/dL (ref 1.5–4.5)
Glucose: 124 mg/dL — ABNORMAL HIGH (ref 70–99)
Potassium: 4 mmol/L (ref 3.5–5.2)
Sodium: 138 mmol/L (ref 134–144)
Total Protein: 7.3 g/dL (ref 6.0–8.5)
eGFR: 121 mL/min/{1.73_m2} (ref >59)

## 2022-04-13 LAB — LIPID PANEL
Chol/HDL Ratio: 5 ratio — ABNORMAL HIGH (ref 0.0–4.4)
Cholesterol, Total: 190 mg/dL (ref 100–199)
HDL: 38 mg/dL — ABNORMAL LOW (ref >39)
LDL Chol Calc (NIH): 120 mg/dL — ABNORMAL HIGH (ref 0–99)
Triglycerides: 180 mg/dL — ABNORMAL HIGH (ref 0–149)
VLDL Cholesterol Cal: 32 mg/dL (ref 5–40)

## 2022-04-13 LAB — CBC WITH DIFFERENTIAL/PLATELET
Basophils Absolute: 0 10*3/uL (ref 0.0–0.2)
Basos: 1 %
EOS (ABSOLUTE): 0.2 10*3/uL (ref 0.0–0.4)
Eos: 3 %
Hematocrit: 41.6 % (ref 34.0–46.6)
Hemoglobin: 14.6 g/dL (ref 11.1–15.9)
Immature Grans (Abs): 0 10*3/uL (ref 0.0–0.1)
Immature Granulocytes: 0 %
Lymphocytes Absolute: 2 10*3/uL (ref 0.7–3.1)
Lymphs: 31 %
MCH: 32.5 pg (ref 26.6–33.0)
MCHC: 35.1 g/dL (ref 31.5–35.7)
MCV: 93 fL (ref 79–97)
Monocytes Absolute: 0.4 10*3/uL (ref 0.1–0.9)
Monocytes: 6 %
Neutrophils Absolute: 3.8 10*3/uL (ref 1.4–7.0)
Neutrophils: 59 %
Platelets: 276 10*3/uL (ref 150–450)
RBC: 4.49 x10E6/uL (ref 3.77–5.28)
RDW: 11.9 % (ref 11.7–15.4)
WBC: 6.4 10*3/uL (ref 3.4–10.8)

## 2022-04-13 LAB — MICROALBUMIN, URINE: Microalbumin, Urine: 21.7 ug/mL

## 2022-04-14 ENCOUNTER — Other Ambulatory Visit: Payer: Self-pay | Admitting: Nurse Practitioner

## 2022-04-14 MED ORDER — ATORVASTATIN CALCIUM 10 MG PO TABS
10.0000 mg | ORAL_TABLET | Freq: Every day | ORAL | 3 refills | Status: DC
  Filled 2022-04-14 – 2022-04-19 (×2): qty 90, 90d supply, fill #0
  Filled 2022-11-13 – 2022-11-14 (×2): qty 90, 90d supply, fill #1

## 2022-04-15 ENCOUNTER — Other Ambulatory Visit: Payer: Self-pay

## 2022-04-19 ENCOUNTER — Other Ambulatory Visit: Payer: Self-pay

## 2022-04-22 ENCOUNTER — Other Ambulatory Visit (HOSPITAL_COMMUNITY): Payer: Self-pay

## 2022-04-22 ENCOUNTER — Other Ambulatory Visit: Payer: Self-pay

## 2022-04-29 ENCOUNTER — Other Ambulatory Visit: Payer: Self-pay

## 2022-05-17 ENCOUNTER — Ambulatory Visit (INDEPENDENT_AMBULATORY_CARE_PROVIDER_SITE_OTHER): Payer: Self-pay | Admitting: Primary Care

## 2022-05-21 ENCOUNTER — Ambulatory Visit: Payer: Self-pay | Admitting: Pharmacist

## 2022-05-24 ENCOUNTER — Other Ambulatory Visit: Payer: Self-pay

## 2022-06-09 ENCOUNTER — Other Ambulatory Visit (INDEPENDENT_AMBULATORY_CARE_PROVIDER_SITE_OTHER): Payer: Self-pay | Admitting: Primary Care

## 2022-06-09 DIAGNOSIS — Z760 Encounter for issue of repeat prescription: Secondary | ICD-10-CM

## 2022-06-09 DIAGNOSIS — E119 Type 2 diabetes mellitus without complications: Secondary | ICD-10-CM

## 2022-06-09 MED ORDER — TRULICITY 0.75 MG/0.5ML ~~LOC~~ SOAJ
0.7500 mg | SUBCUTANEOUS | 1 refills | Status: DC
  Filled 2022-06-09 – 2022-11-13 (×4): qty 2, 28d supply, fill #0
  Filled 2023-01-24 (×2): qty 2, 28d supply, fill #1

## 2022-06-10 ENCOUNTER — Other Ambulatory Visit: Payer: Self-pay

## 2022-07-22 ENCOUNTER — Other Ambulatory Visit: Payer: Self-pay

## 2022-07-22 ENCOUNTER — Other Ambulatory Visit (HOSPITAL_COMMUNITY): Payer: Self-pay

## 2022-07-29 ENCOUNTER — Other Ambulatory Visit: Payer: Self-pay

## 2022-11-14 ENCOUNTER — Other Ambulatory Visit: Payer: Self-pay

## 2022-11-15 ENCOUNTER — Other Ambulatory Visit: Payer: Self-pay

## 2022-11-20 ENCOUNTER — Encounter (HOSPITAL_COMMUNITY): Payer: Self-pay

## 2022-11-20 ENCOUNTER — Ambulatory Visit (HOSPITAL_COMMUNITY)
Admission: EM | Admit: 2022-11-20 | Discharge: 2022-11-20 | Disposition: A | Discharge status: Home / Self Care | Payer: Self-pay | Attending: Emergency Medicine | Admitting: Emergency Medicine

## 2022-11-20 DIAGNOSIS — M79672 Pain in left foot: Secondary | ICD-10-CM

## 2022-11-20 DIAGNOSIS — M79671 Pain in right foot: Secondary | ICD-10-CM

## 2022-11-20 LAB — CBG MONITORING, ED: Glucose-Capillary: 96 mg/dL (ref 70–99)

## 2022-11-20 MED ORDER — GABAPENTIN 100 MG PO CAPS
100.0000 mg | ORAL_CAPSULE | Freq: Every day | ORAL | 0 refills | Status: DC
  Filled 2022-11-20: qty 30, 30d supply, fill #0

## 2022-11-20 NOTE — Discharge Instructions (Addendum)
CBG es 96  Lo ms probable es que los sntomas sean neuropata, relacionada con la ausencia de diabetes. Dentro de su paquete encontrar ms informacin sobre Personnel officer.  Puede usar gabapentina [employer] antes de acostarse para ayudar a Chiropodist.   Puede usar [employer] de compresin que puede Pension scheme manager en lnea o en la tienda y que ayuda a promover la circulacin sangunea, lo que idealmente ayudar a minimizar su seal, puede usarla durante el da y quitarla durante la noche.  Puede usar hielo o calor sobre las reas afectadas en intervalos de 10 a 15 minutos.  Puede elevar las piernas cuando est sentado o acostado para agregar 46 y comodidad adicionales.  Programe una cita de seguimiento con su mdico de atencin primaria en 1 a 2 semanas para una reevaluacin.    CBG is 96  Symptoms are most likely neuropathy, related to being off diabtes inside your packet is more information regarding this condition  You may use gabapentin at bedtime daily to help manage her pain   You may use compression hose which you may find online or in store which helps to promote blood circulation which ideally will help to minimize your sign, may wear throughout the day and remove at nighttime  May use ice or heat over the affected areas in 10 to 15-minute intervals  You may elevate legs when sitting and lying to add additional support and comfort  Please schedule follow-up appointment with your primary doctor in 1 to 2 weeks for reevaluation

## 2022-11-20 NOTE — ED Triage Notes (Signed)
Pt reports bilateral leg pain x 1 month.  States the pain has worsened and reports she is diabetic, states she is concerned. States she does not check her sugar and states she just started injecting insulin.   Pt reports she had went 3-4 months without medication.   States she has had blurry vision and reports dizziness.

## 2022-11-20 NOTE — ED Notes (Signed)
Pt reports she feels she cannot breathe during her sleep. Denies chest congestion, reports chest pain and sharp pains. States she feels she is choking.

## 2022-11-20 NOTE — ED Provider Notes (Signed)
[place]    CSN: [id number] Arrival date & time: 11/20/22  1617      History   Chief Complaint Chief Complaint  Patient presents with   Foot Pain    HPI Lindsay Hunt is a 37 y.o. female.   Patient presents with constant bilateral foot pain occurring for 1 month.  Symptoms are worse in the morning and can be felt when bearing weight.  Has full range of motion of both feet and ankles.  Pain is described as a pins and needle sensation and is primarily along the bottom of the feet.  Denies injury or trauma, numbness.  History of diabetes, had stopped taking medicine for approximately 3 to 4 months, currently on insulin taking medication as directed.    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 Authorizing Provider  atorvastatin (LIPITOR) 10 MG tablet Take 1 tablet (10 mg total) by mouth daily. 04/14/22   Gildardo Pounds, NP  blood glucose meter kit and supplies KIT Dispense based on patient and insurance preference. Use up to four times daily as directed. (FOR ICD-9 250.00, 250.01). 02/20/17   Clent Demark, PA-C  Dulaglutide (TRULICITY) 2.72 ZD/6.6YQ SOPN Inject 0.75 mg into the skin once a week. 06/09/22   Kerin Perna, NP  lisinopril (ZESTRIL)  2.5 MG tablet Tome Sunrise 04/12/22   Kerin Perna, NP    Family History Family History  Problem Relation Age of Onset   Diabetes Mother    Cancer Mother    Diabetes Father    Diabetes Sister    Diabetes Brother    Diabetes Maternal Grandmother    Cancer Maternal Grandmother    Diabetes Maternal Grandfather    Diabetes Paternal Grandmother    Diabetes Paternal Grandfather     Social History Social History   Tobacco Use   Smoking status: Never   Smokeless tobacco: Never  Vaping Use   Vaping Use: Never used  Substance Use Topics   Alcohol use: No   Drug use: Never     Allergies   Patient has no known allergies.   Review of Systems Review of Systems  Constitutional: Negative.   Respiratory: Negative.    Cardiovascular: Negative.   Musculoskeletal:  Positive for gait problem and myalgias. Negative for arthralgias, back pain, joint swelling, neck pain and neck stiffness.  Skin: Negative.      Physical Exam Triage Vital Signs  ED Triage Vitals  Enc Vitals Group     BP 11/20/22 1732 127/84     Pulse Rate 11/20/22 1732 67     Resp 11/20/22 1732 18     Temp 11/20/22 1732 98.2 F (36.8 C)     Temp Source 11/20/22 1732 Oral     SpO2 11/20/22 1732 97 %     Weight --      Height --      Head Circumference --      Peak Flow --      Pain Score 11/20/22 1731 5     Pain Loc --      Pain Edu? --      Excl. in [place]? --    No data found.  Updated Vital Signs BP 127/84 (BP Location: Right Arm)   Pulse 67   Temp 98.2 F (36.8 C) (Oral)   Resp 18   SpO2 97%   Visual Acuity Right Eye Distance:   Left Eye Distance:   Bilateral Distance:    Right Eye Near:   Left Eye Near:    Bilateral Near:     Physical Exam Constitutional:      Appearance: Normal appearance.  HENT:     Head: Normocephalic.  Eyes:     Extraocular Movements: Extraocular movements intact.  Pulmonary:     Effort: Pulmonary effort is normal.  Feet:     Comments: No  ecchymosis, swelling or deformity present to the bilateral feet, sensation is intact, 2+ pedal pulses, capillary refill less than 3 and all 10 digits, able to bear weight, range of motion of the ankles and foot intact Neurological:     Mental Status: She is alert and oriented to person, place, and time. Mental status is at baseline.      UC Treatments / Results  Labs (all labs ordered are listed, but only abnormal results are displayed) Labs Reviewed  CBG MONITORING, ED  CBG MONITORING, ED    EKG   Radiology No results found.  Procedures Procedures (including critical care time)  Medications Ordered in UC Medications - No data to display  Initial Impression / Assessment and Plan / UC Course  I have reviewed the triage vital signs and the nursing notes.  Pertinent labs & imaging results that were available during my care of the patient were reviewed by me and considered in my medical decision making (see chart for details).  Bilateral foot pain  Etiology is most likely neuropathy related to prior uncontrolled diabetes, discussed with patient through use of interpreter, defer imaging due to lack of injury, prescribed gabapentin for daily at bedtime, 1 month supply givens supportive care recommended compression stockings, elevation, Tylenol and Motrin as needed,  advised follow-up within this timeframe with primary doctor for reevaluation a Final Clinical Impressions(s) / UC Diagnoses   Final diagnoses:  Bilateral leg pain     Discharge Instructions      CBG is 96  You may use compression hose which you may find online or in store which helps to promote blood circulation which ideally will help to minimize your sign, may wear throughout the day and remove at nighttime  May use ice or heat over the affected areas in 10 to 15-minute intervals  You may elevate legs when sitting and lying to add additional support and comfort  Please schedule follow-up appointment with  your primary doctor in 1 to 2 weeks for reevaluation   ED Prescriptions   None  PDMP not reviewed this encounter.   Hans Eden, [place] 11/20/22 [phone]

## 2022-11-21 ENCOUNTER — Other Ambulatory Visit: Payer: Self-pay

## 2023-01-24 ENCOUNTER — Other Ambulatory Visit: Payer: Self-pay | Admitting: Emergency Medicine

## 2023-01-24 ENCOUNTER — Other Ambulatory Visit: Payer: Self-pay

## 2023-01-27 ENCOUNTER — Other Ambulatory Visit: Payer: Self-pay

## 2023-01-30 ENCOUNTER — Other Ambulatory Visit: Payer: Self-pay

## 2023-02-03 ENCOUNTER — Ambulatory Visit (INDEPENDENT_AMBULATORY_CARE_PROVIDER_SITE_OTHER): Payer: Self-pay | Admitting: Primary Care

## 2023-02-03 ENCOUNTER — Encounter (INDEPENDENT_AMBULATORY_CARE_PROVIDER_SITE_OTHER): Payer: Self-pay | Admitting: Primary Care

## 2023-02-03 VITALS — BP 126/81 | HR 54 | Resp 16 | Ht 61.0 in | Wt 165.6 lb

## 2023-02-03 DIAGNOSIS — G4733 Obstructive sleep apnea (adult) (pediatric): Secondary | ICD-10-CM

## 2023-02-03 DIAGNOSIS — E119 Type 2 diabetes mellitus without complications: Secondary | ICD-10-CM

## 2023-02-03 DIAGNOSIS — Z6830 Body mass index (BMI) 30.0-30.9, adult: Secondary | ICD-10-CM

## 2023-02-03 DIAGNOSIS — G629 Polyneuropathy, unspecified: Secondary | ICD-10-CM

## 2023-02-03 DIAGNOSIS — E6609 Other obesity due to excess calories: Secondary | ICD-10-CM

## 2023-02-03 DIAGNOSIS — N393 Stress incontinence (female) (male): Secondary | ICD-10-CM

## 2023-02-03 LAB — POCT GLYCOSYLATED HEMOGLOBIN (HGB A1C): HbA1c, POC (controlled diabetic range): 6.8 % (ref 0.0–7.0)

## 2023-02-03 NOTE — Patient Instructions (Signed)
Cmo hacer ejercicios de Kegel Asegrese de que su vejiga est vaca y luego sintese o acustese. Aprieta los msculos del suelo plvico. Mantngase firme y cuente de 3 a 5 segundos. Relaja los msculos y cuenta de 3 a 5 segundos. Repetir 10 veces, 3 veces al da (maana, tarde y noche).

## 2023-02-03 NOTE — Progress Notes (Signed)
Subjective:  Patient ID: Lupita Leash, female    DOB: [birth year]  Age: 37 y.o. MRN: [id number]  CC: Diabetes and Foot Pain (B/l feet )   HPI Ms.Bhakti Vaughan Browner is a 37 year old obese Hispanic female who presents for follow-up of diabetes.[employer] [phone]) patient does check blood sugar at home. She c/o bilateral feet pain/burning for 3 months. She has stop taking DM medication. She has stress incontinence- cough, sneeze , laugh not dripping but feels a lot.  Also, she states she snores bad and stops breathing and wakes up this has been present for 1 year.   Compliant with meds - No Checking CBGs? Yes  Fasting avg - 130-140   Postprandial average -  Exercising regularly? - Yes Watching carbohydrate intake? - Yes Neuropathy ? - Yes Hypoglycemic events - No  - Recovers with :   Pertinent ROS:  Polyuria - No Polydipsia - No Vision problems - yes  Medications as noted below. Not taking them regularly without complication/adverse reaction being reported today.   History Fatim has a past medical history of Diabetes mellitus without complication, Hyperlipidemia, and Hypertension.   She has a past surgical history that includes No past surgeries.   Her family history includes Cancer in her maternal grandmother and mother; Diabetes in her brother, father, maternal grandfather, maternal grandmother, mother, paternal grandfather, paternal grandmother, and sister.She reports that she has never smoked. She has never used smokeless tobacco. She reports that she does not drink alcohol and does not use drugs.  Current Outpatient Medications on File Prior to Visit  Medication Sig Dispense Refill   atorvastatin (LIPITOR) 10 MG tablet Take 1 tablet (10 mg total) by mouth daily. (Patient not taking: Reported on 02/03/2023) 90 tablet 3   blood glucose meter kit and supplies KIT Dispense based on patient and insurance preference. Use up to four times daily as directed. (FOR  ICD-9 250.00, 250.01). (Patient not taking: Reported on 02/03/2023) 1 each 0   Dulaglutide (TRULICITY) 0.75 MG/0.5ML SOPN Inject 0.75 mg into the skin once a week. (Patient not taking: Reported on 02/03/2023) 2 mL 1   gabapentin (NEURONTIN) 100 MG capsule Take 1 capsule (100 mg total) by mouth at bedtime. (Patient not taking: Reported on 02/03/2023) 30 capsule 0   lisinopril (ZESTRIL) 2.5 MG tablet Tome 1 tableta [facility] (Patient not taking: Reported on 02/03/2023) 90 tablet 0   No current facility-administered medications on file prior to visit.    ROS Comprehensive ROS Pertinent positive and negative noted in HPI    Objective:  Blood Pressure 126/81   Pulse (Abnormal) 54   Respiration 16   Height  (1.549 m)   Weight 165 lb 9.6 oz (75.1 kg)   Oxygen Saturation 100%   Body Mass Index 31.29 kg/m   BP Readings from Last 3 Encounters:  02/03/23 126/81  11/20/22 127/84  04/12/22 121/79    Wt Readings from Last 3 Encounters:  02/03/23 165 lb 9.6 oz (75.1 kg)  04/12/22 166 lb 3.2 oz (75.4 kg)  01/10/22 168 lb 3.2 oz (76.3 kg)    Physical Exam Vitals reviewed.  Constitutional:      Appearance: She is obese.  HENT:     Head: Normocephalic.     Right Ear: Tympanic membrane and external ear normal.     Left Ear: Tympanic membrane and external ear normal.  Eyes:     Extraocular Movements: Extraocular movements intact.     Pupils: Pupils are equal, round,  and reactive to light.  Cardiovascular:     Rate and Rhythm: Normal rate and regular rhythm.  Pulmonary:     Effort: Pulmonary effort is normal.     Breath sounds: Normal breath sounds.  Abdominal:     General: Bowel sounds are normal. There is distension.     Palpations: Abdomen is soft.  Musculoskeletal:        General: Normal range of motion.     Cervical back: Normal range of motion and neck supple.  Skin:    General: Skin is warm and dry.  Neurological:     Mental Status: She is alert and oriented to person,  place, and time.  Psychiatric:        Mood and Affect: Mood normal.        Behavior: Behavior normal.   Lab Results  Component Value Date   HGBA1C 6.8 02/03/2023   HGBA1C 6.6 (A) 04/12/2022   HGBA1C 8.4 (A) 01/10/2022    Lab Results  Component Value Date   WBC 6.4 04/12/2022   HGB 14.6 04/12/2022   HCT 41.6 04/12/2022   PLT 276 04/12/2022   GLUCOSE 124 (H) 04/12/2022   CHOL 190 04/12/2022   TRIG 180 (H) 04/12/2022   HDL 38 (L) 04/12/2022   LDLCALC 120 (H) 04/12/2022   ALT 72 (H) 04/12/2022   AST 40 04/12/2022   NA 138 04/12/2022   K 4.0 04/12/2022   CL 103 04/12/2022   CREATININE 0.54 (L) 04/12/2022   BUN 8 04/12/2022   CO2 23 04/12/2022   TSH 3.270 12/03/2018   HGBA1C 6.8 02/03/2023     Assessment & Plan:  Oluwatobi was seen today for diabetes and foot pain.  Diagnoses and all orders for this visit:  Type 2 diabetes mellitus without complication, without long-term current use of insulin -     POCT glycosylated hemoglobin (Hb A1C) 6.8  - educated on lifestyle modifications, including but not limited to diet choices and adding exercise to daily routine.    Class 1 obesity due to excess calories with serious comorbidity and body mass index (BMI) of 30.0 to 30.9 in adult Obesity is 30-39 indicating an excess in caloric intake or underlining conditions. This may lead to other co-morbidities. Educated on lifestyle modifications of diet and exercise which may reduce obesity.    Neuropathy Was taking gabapentin and stopped stated did not help    OSA (obstructive sleep apnea) Patient presents with possible obstructive sleep apnea. Patent has a 1 year history of symptoms of daytime fatigue, morning fatigue, and morning headache. Patient generally gets 3 or 4 hours of sleep per night, and states they generally have difficulty falling asleep and difficulty falling back asleep if awakened. Snoring of moderate severity is present. Apneic episodes is present. Nasal obstruction is  not present.  Patient has not had tonsillectomy.    Stress incontinence in female INCONTINENCE:  During the last three months, have you leaked urine (even if a small amount)?  Yes[x]   No[]   2. During the last three months, did you leak urine (check all that apply): When you were performing some physical activity, such as coughing, sneezing, lifting, or exercise?  When you had the urge or the feeling that you needed to empty your bladder, but you could not get to the toilet fast enough? Without physical activity and without a sense of urgency?   3. During the last three months, did you leak urine most often (check only one):  When you  were performing some physical activity, such as coughing, sneezing, lifting, or exercise?  [x] When you had the urge or the feeling that you needed to empty your bladder, but you could not get to the toilet fast enough? [] Without physical activity and without a sense of urgency?  [] About equally as often with physical activity as with a sense of urgency?   Definitions of type of urinary incontinence are based on responses to question 3: Most often with physical activity: stress only or stress predominant Most often with the urge to empty the bladder: urge only or urge predominant Without physical activity or sense of urgency: other cause only or other cause predominant  About equally with physical activity and sense of urgency: mixed    Kegel exercise on AVS   Follow-up:   Return in about 6 weeks (around 03/17/2023) for pap.  The above assessment and management plan was discussed with the patient. The patient verbalized understanding of and has agreed to the management plan. Patient is aware to call the clinic if symptoms fail to improve or worsen. Patient is aware when to return to the clinic for a follow-up visit. Patient educated on when it is appropriate to go to the emergency department.   Gwinda Passe, NP-C

## 2023-02-05 LAB — MICROALBUMIN / CREATININE URINE RATIO
Creatinine, Urine: 39.3 mg/dL
Microalb/Creat Ratio: 37 mg/g creat — ABNORMAL HIGH (ref 0–29)
Microalbumin, Urine: 14.4 ug/mL

## 2023-03-17 ENCOUNTER — Encounter (INDEPENDENT_AMBULATORY_CARE_PROVIDER_SITE_OTHER): Payer: Self-pay | Admitting: Primary Care

## 2023-03-17 ENCOUNTER — Ambulatory Visit (INDEPENDENT_AMBULATORY_CARE_PROVIDER_SITE_OTHER): Payer: Self-pay | Admitting: Primary Care

## 2023-03-17 ENCOUNTER — Other Ambulatory Visit (HOSPITAL_COMMUNITY)
Admission: RE | Admit: 2023-03-17 | Discharge: 2023-03-17 | Disposition: A | Discharge status: Home / Self Care | Payer: Self-pay | Source: Ambulatory Visit | Attending: Primary Care | Admitting: Primary Care

## 2023-03-17 VITALS — BP 120/81 | HR 67 | Resp 16 | Wt 167.0 lb

## 2023-03-17 DIAGNOSIS — Z124 Encounter for screening for malignant neoplasm of cervix: Secondary | ICD-10-CM | POA: Insufficient documentation

## 2023-03-19 LAB — CERVICOVAGINAL ANCILLARY ONLY
Bacterial Vaginitis (gardnerella): NEGATIVE
Candida Glabrata: POSITIVE — AB
Candida Vaginitis: NEGATIVE
Chlamydia: NEGATIVE
Comment: NEGATIVE
Comment: NEGATIVE
Comment: NEGATIVE
Comment: NEGATIVE
Comment: NEGATIVE
Comment: NORMAL
Neisseria Gonorrhea: NEGATIVE
Trichomonas: NEGATIVE

## 2023-03-21 ENCOUNTER — Other Ambulatory Visit: Payer: Self-pay

## 2023-03-21 ENCOUNTER — Other Ambulatory Visit (INDEPENDENT_AMBULATORY_CARE_PROVIDER_SITE_OTHER): Payer: Self-pay | Admitting: Primary Care

## 2023-03-21 LAB — CYTOLOGY - PAP
Comment: NEGATIVE
Diagnosis: NEGATIVE
High risk HPV: NEGATIVE

## 2023-03-21 MED ORDER — FLUCONAZOLE 150 MG PO TABS
150.0000 mg | ORAL_TABLET | Freq: Every day | ORAL | 1 refills | Status: DC
  Filled 2023-03-21 – 2023-03-31 (×2): qty 1, 1d supply, fill #0
  Filled 2023-04-09: qty 1, 1d supply, fill #1

## 2023-03-22 NOTE — Progress Notes (Signed)
  [facility]  WELL-WOMAN PHYSICAL & PAP Patient name: Lindsay Hunt MRN [id number]  Date of birth: 1986/05/02 Chief Complaint:   Gynecologic Exam  History of Present Illness:   Lindsay Hunt is a 37 y.o. [phone] female being seen today for a routine well-woman exam.   CC  The current method of family planning is IUD.  No LMP recorded. (Menstrual status: IUD). Last pap 06/02/18.  Last mammogram: Family h/o breast cancer: No Last colonoscopy: . Family h/o colorectal cancer: Yes  Review of Systems:    Denies any headaches, blurred vision, fatigue, shortness of breath, chest pain, abdominal pain, abnormal vaginal discharge/itching/odor/irritation, problems with periods, bowel movements, urination, or intercourse unless otherwise stated above.  Pertinent History Reviewed:   Reviewed past medical,surgical, social and family history.  Reviewed problem list, medications and allergies.  Physical Assessment:   Vitals:   03/17/23 1607  BP: 120/81  Pulse: 67  Resp: 16  SpO2: 99%  Weight: 167 lb (75.8 kg)  Body mass index is 31.55 kg/m.        Physical Examination:  General appearance - well appearing, and in no distress Mental status - alert, oriented to person, place, and time Psych:  She has a normal mood and affect Skin - warm and dry, normal color, no suspicious lesions noted Chest - effort normal, all lung fields clear to auscultation bilaterally Heart - normal rate and regular rhythm Neck:  midline trachea, no thyromegaly or nodules Breasts - breasts appear normal, no suspicious masses, no skin or nipple changes or axillary nodes Educated patient on proper self breast examination and had patient to demonstrate SBE. Abdomen - soft, nontender, nondistended, no masses or organomegaly Pelvic-VULVA: normal appearing vulva with no masses, tenderness or lesions   VAGINA: normal appearing vagina with normal color and discharge, no lesions   CERVIX:  normal appearing cervix without discharge or lesions, no CMT UTERUS: uterus is felt to be normal size, shape, consistency and nontender  ADNEXA: No adnexal masses or tenderness noted. Extremities:  No swelling or varicosities noted  No results found for this or any previous visit (from the past 24 hour(s)).   Assessment & Plan:  Lindsay Hunt was seen today for gynecologic exam.  Diagnoses and all orders for this visit:  Cervical cancer screening -     Cervicovaginal ancillary only -     Cytology - PAP    Follow-up: No follow-ups on file.  This note has been created with Education officer, environmental. Any transcriptional errors are unintentional.   Grayce Sessions, NP 03/22/2023, 1:55 PM

## 2023-03-27 ENCOUNTER — Other Ambulatory Visit: Payer: Self-pay

## 2023-03-31 ENCOUNTER — Other Ambulatory Visit (INDEPENDENT_AMBULATORY_CARE_PROVIDER_SITE_OTHER): Payer: Self-pay

## 2023-03-31 ENCOUNTER — Other Ambulatory Visit: Payer: Self-pay

## 2023-03-31 ENCOUNTER — Other Ambulatory Visit: Payer: Self-pay | Admitting: Emergency Medicine

## 2023-03-31 ENCOUNTER — Other Ambulatory Visit: Payer: Self-pay | Admitting: Nurse Practitioner

## 2023-03-31 ENCOUNTER — Other Ambulatory Visit (INDEPENDENT_AMBULATORY_CARE_PROVIDER_SITE_OTHER): Payer: Self-pay | Admitting: Primary Care

## 2023-03-31 DIAGNOSIS — Z760 Encounter for issue of repeat prescription: Secondary | ICD-10-CM

## 2023-03-31 DIAGNOSIS — E119 Type 2 diabetes mellitus without complications: Secondary | ICD-10-CM

## 2023-03-31 MED ORDER — ATORVASTATIN CALCIUM 10 MG PO TABS
10.0000 mg | ORAL_TABLET | Freq: Every day | ORAL | 0 refills | Status: DC
  Filled 2023-03-31: qty 90, 90d supply, fill #0

## 2023-04-01 ENCOUNTER — Other Ambulatory Visit: Payer: Self-pay

## 2023-04-01 ENCOUNTER — Other Ambulatory Visit (INDEPENDENT_AMBULATORY_CARE_PROVIDER_SITE_OTHER): Payer: Self-pay | Admitting: Primary Care

## 2023-04-01 NOTE — Telephone Encounter (Signed)
Unable to refill per protocol, Rx expired. Medications have been discontinued and no longer on active list.  Requested Prescriptions  Pending Prescriptions Disp Refills   Semaglutide,0.25 or 0.5MG /DOS, (OZEMPIC, 0.25 OR 0.5 MG/DOSE,) 2 MG/1.5ML SOPN 1.5 mL 0    Sig: Inject 0.25 mg into the skin once a week.     Endocrinology:  Diabetes - GLP-1 Receptor Agonists - semaglutide Failed - 03/31/2023  2:53 PM      Failed - Cr in normal range and within 360 days    Creatinine, Ser  Date Value Ref Range Status  04/12/2022 0.54 (L) 0.57 - 1.00 mg/dL Final         Passed - HBA1C in normal range and within 180 days    HbA1c, POC (controlled diabetic range)  Date Value Ref Range Status  02/03/2023 6.8 0.0 - 7.0 % Final         Passed - Valid encounter within last 6 months    Recent Outpatient Visits           2 weeks ago Cervical cancer screening   [place] [facility] Grayce Sessions, NP   1 month ago Type 2 diabetes mellitus without complication, without long-term current use of insulin (HCC)   [place] [facility] Grayce Sessions, NP   11 months ago Type 2 diabetes mellitus without complication, without long-term current use of insulin (HCC)   [place] [facility] Grayce Sessions, NP   1 year ago Type 2 diabetes mellitus without complication, without long-term current use of insulin (HCC)   [place] [facility] Grayce Sessions, NP   2 years ago Type 2 diabetes mellitus without complication, without long-term current use of insulin (HCC)   [place] [facility] Bing Neighbors, NP               lisinopril (ZESTRIL) 2.5 MG tablet 90 tablet 0    Sig: Tome 1 tableta [address]     Cardiovascular:  ACE Inhibitors Failed - 03/31/2023  2:53 PM      Failed - Cr in normal range and within 180 days    Creatinine, Ser  Date Value Ref Range Status  04/12/2022 0.54 (L) 0.57 -  1.00 mg/dL Final         Failed - K in normal range and within 180 days    Potassium  Date Value Ref Range Status  04/12/2022 4.0 3.5 - 5.2 mmol/L Final         Passed - Patient is not pregnant      Passed - Last BP in normal range    BP Readings from Last 1 Encounters:  03/17/23 120/81         Passed - Valid encounter within last 6 months    Recent Outpatient Visits           2 weeks ago Cervical cancer screening   [place] [facility] Grayce Sessions, NP   1 month ago Type 2 diabetes mellitus without complication, without long-term current use of insulin (HCC)   [place] [facility] Grayce Sessions, NP   11 months ago Type 2 diabetes mellitus without complication, without long-term current use of insulin (HCC)    [facility] Grayce Sessions, NP   1 year ago Type 2 diabetes mellitus without complication, without long-term current use of insulin (HCC)    [facility] Grayce Sessions, NP   2 years ago Type 2 diabetes mellitus without complication, without long-term current use of insulin (HCC)   [place] [facility] Bing Neighbors, NP               Dulaglutide (TRULICITY) 0.75 MG/0.5ML SOPN 2 mL 1    Sig: Inject 0.75 mg into the skin once a week.     Endocrinology:  Diabetes - GLP-1 Receptor Agonists Passed - 03/31/2023  2:53 PM      Passed - HBA1C is between 0 and 7.9 and within 180 days    HbA1c, POC (controlled diabetic range)  Date Value Ref Range Status  02/03/2023 6.8 0.0 - 7.0 % Final         Passed - Valid encounter within last 6 months    Recent Outpatient Visits           2 weeks ago Cervical cancer screening   [place] [facility] Grayce Sessions, NP   1 month ago Type 2 diabetes mellitus without complication, without long-term current use of insulin (HCC)   [place] [facility]  Grayce Sessions, NP   11 months ago Type 2 diabetes mellitus without complication, without long-term current use of insulin (HCC)   [place] [facility] Grayce Sessions, NP   1 year ago Type 2 diabetes mellitus without complication, without long-term current use of insulin (HCC)   [place] [facility] Grayce Sessions, NP   2 years ago Type 2 diabetes mellitus without complication, without long-term current use of insulin (HCC)   [place] [facility] Bing Neighbors, NP

## 2023-04-01 NOTE — Telephone Encounter (Signed)
Requested by interface surescripts. Medications discontinued 01/10/22 by Helen Hashimoto, NP.  Requested Prescriptions  Refused Prescriptions Disp Refills   famotidine (PEPCID) 40 MG tablet 168 tablet 0    Sig: TAKE 1 TABLET (40 MG TOTAL) BY MOUTH 2 (TWO) TIMES DAILY AS NEEDED FOR HEARTBURN OR INDIGESTION.     Gastroenterology:  H2 Antagonists Passed - 04/01/2023  8:11 AM      Passed - Valid encounter within last 12 months    Recent Outpatient Visits           2 weeks ago Cervical cancer screening   [place] [facility] Grayce Sessions, NP   1 month ago Type 2 diabetes mellitus without complication, without long-term current use of insulin (HCC)   [place] [facility] Grayce Sessions, NP   11 months ago Type 2 diabetes mellitus without complication, without long-term current use of insulin (HCC)   [place] [facility] Grayce Sessions, NP   1 year ago Type 2 diabetes mellitus without complication, without long-term current use of insulin (HCC)   [place] [facility] Grayce Sessions, NP   2 years ago Type 2 diabetes mellitus without complication, without long-term current use of insulin (HCC)   [place] [facility] Bing Neighbors, NP               metFORMIN (GLUCOPHAGE) 1000 MG tablet 180 tablet 3    Sig: TAKE 1 TABLET (1,000 MG TOTAL) BY MOUTH 2 (TWO) TIMES DAILY WITH A MEAL.     Endocrinology:  Diabetes - Biguanides Failed - 04/01/2023  8:11 AM      Failed - Cr in normal range and within 360 days    Creatinine, Ser  Date Value Ref Range Status  04/12/2022 0.54 (L) 0.57 - 1.00 mg/dL Final         Failed - B12 Level in normal range and within 720 days    No results found for: "VITAMINB12"       Passed - HBA1C is between 0 and 7.9 and within 180 days    HbA1c, POC (controlled diabetic range)  Date Value Ref Range Status  02/03/2023 6.8 0.0 - 7.0 % Final          Passed - eGFR in normal range and within 360 days    GFR calc Af Amer  Date Value Ref Range Status  07/28/2019 131 >59 mL/min/1.73 Final   GFR, Estimated  Date Value Ref Range Status  02/23/2021 >60 >60 mL/min Final    Comment:    (NOTE) Calculated using the CKD-EPI Creatinine Equation (2021)    eGFR  Date Value Ref Range Status  04/12/2022 121 >59 mL/min/1.73 Final         Passed - Valid encounter within last 6 months    Recent Outpatient Visits           2 weeks ago Cervical cancer screening   [place] [facility] Grayce Sessions, NP   1 month ago Type 2 diabetes mellitus without complication, without long-term current use of insulin (HCC)   [place] [facility] Grayce Sessions, NP   11 months ago Type 2 diabetes mellitus without complication, without long-term current use of insulin (HCC)   [place] [facility] Grayce Sessions, NP   1 year ago Type 2 diabetes mellitus without complication, without long-term current use of insulin (HCC)     [facility] Grayce Sessions, NP   2 years ago Type 2 diabetes mellitus without complication, without long-term current use of insulin (HCC)   [place] [facility] Bing Neighbors, NP              Passed - CBC within normal limits and completed in the last 12 months    WBC  Date Value Ref Range Status  04/12/2022 6.4 3.4 - 10.8 x10E3/uL Final  02/23/2021 8.4 4.0 - 10.5 K/uL Final   RBC  Date Value Ref Range Status  04/12/2022 4.49 3.77 - 5.28 x10E6/uL Final  02/23/2021 4.00 3.87 - 5.11 MIL/uL Final   Hemoglobin  Date Value Ref Range Status  04/12/2022 14.6 11.1 - 15.9 g/dL Final  84/13/2440 10.2  Final   HCT  Date Value Ref Range Status  08/13/2018 37 29 - 41 Final   Hematocrit  Date Value Ref Range Status  04/12/2022 41.6 34.0 - 46.6 % Final   MCHC  Date Value Ref Range Status  04/12/2022 35.1  31.5 - 35.7 g/dL Final  72/53/6644 03.4 30.0 - 36.0 g/dL Final   [facility]  Date Value Ref Range Status  04/12/2022 32.5 26.6 - 33.0 pg Final  02/23/2021 29.8 26.0 - 34.0 pg Final   MCV  Date Value Ref Range Status  04/12/2022 93 79 - 97 fL Final   No results found for: "PLTCOUNTKUC", "LABPLAT", "POCPLA" RDW  Date Value Ref Range Status  04/12/2022 11.9 11.7 - 15.4 % Final          pravastatin (PRAVACHOL) 80 MG tablet 90 tablet 1    Sig: TAKE 1 TABLET (80 MG TOTAL) BY MOUTH DAILY.     Cardiovascular:  Antilipid - Statins Failed - 04/01/2023  8:11 AM      Failed - Lipid Panel in normal range within the last 12 months    Cholesterol, Total  Date Value Ref Range Status  04/12/2022 190 100 - 199 mg/dL Final   LDL Chol Calc (NIH)  Date Value Ref Range Status  04/12/2022 120 (H) 0 - 99 mg/dL Final   HDL  Date Value Ref Range Status  04/12/2022 38 (L) >39 mg/dL Final   Triglycerides  Date Value Ref Range Status  04/12/2022 180 (H) 0 - 149 mg/dL Final         Passed - Patient is not pregnant      Passed - Valid encounter within last 12 months    Recent Outpatient Visits           2 weeks ago Cervical cancer screening   [place] [facility] Grayce Sessions, NP   1 month ago Type 2 diabetes mellitus without complication, without long-term current use of insulin (HCC)   [place] [facility] Grayce Sessions, NP   11 months ago Type 2 diabetes mellitus without complication, without long-term current use of insulin (HCC)   [place] [facility] Grayce Sessions, NP   1 year ago Type 2 diabetes mellitus without complication, without long-term current use of insulin (HCC)   [place] [facility] Grayce Sessions, NP   2 years ago Type 2 diabetes mellitus without complication, without long-term current use of insulin (HCC)   [place] [facility] Bing Neighbors, NP

## 2023-04-02 ENCOUNTER — Other Ambulatory Visit: Payer: Self-pay

## 2023-04-07 ENCOUNTER — Other Ambulatory Visit: Payer: Self-pay

## 2023-04-09 ENCOUNTER — Other Ambulatory Visit (INDEPENDENT_AMBULATORY_CARE_PROVIDER_SITE_OTHER): Payer: Self-pay | Admitting: Primary Care

## 2023-04-09 ENCOUNTER — Other Ambulatory Visit: Payer: Self-pay

## 2023-04-10 ENCOUNTER — Other Ambulatory Visit: Payer: Self-pay

## 2023-04-16 ENCOUNTER — Other Ambulatory Visit: Payer: Self-pay

## 2024-02-13 ENCOUNTER — Ambulatory Visit: Payer: Self-pay

## 2024-02-13 NOTE — Telephone Encounter (Signed)
 Please contact pt and schedule a follow up with any available provider

## 2024-02-13 NOTE — Telephone Encounter (Signed)
 Chief Complaint: High Blood Sugar Symptoms: Dizziness Frequency: Ongoing, currently untreated Pertinent Negatives: Patient denies CP, dizziness, fever Disposition: [] ED /[x] Urgent Care (no appt availability in office) / [] Appointment(In office/virtual)/ []  [place] [facility]/ [] Home Care/ [] Refused Recommended Disposition /[] [facility]/ []  Follow-up with PCP Additional Notes: Pt reports being without treatment x 1 year, notes BG today was 340, reports only symptoms are increased hunger and intermittent dizziness. Pt notes tingling to face x 2 weeks, denies falls, feeling unsteady for the last week. No OV available, pt advised to proceed to UC for eval/treat. Pt would like follow up appt to start on diabetic medications. This RN educated pt on home care, new-worsening symptoms, when to call back/seek emergent care. Pt verbalized understanding and agrees to plan.    Copied from CRM [id number]. Topic: Clinical - Red Word Triage >> Feb 13, 2024 12:22 PM Carla L wrote: Red Word that prompted transfer to Nurse Triage: dizziness, sugar levels at 340   Interpreter ID: [id number] Abviel Reason for Disposition  [1] Caller has URGENT medication or insulin  pump question AND [2] triager unable to answer question  Answer Assessment - Initial Assessment Questions 1. BLOOD GLUCOSE: "What is your blood glucose level?"      340 2. ONSET: "When did you check the blood glucose?"     Today 3. USUAL RANGE: "What is your glucose level usually?" (e.g., usual fasting morning value, usual evening value)     100-140 AM 6. INSULIN : "Do you take insulin ?" "What type of insulin (s) do you use? What is the mode of delivery? (syringe, pen; injection or pump)?"      None 7. DIABETES PILLS: "Do you take any pills for your diabetes?" If Yes, ask: "Have you missed taking any pills recently?"     None 8. OTHER SYMPTOMS: "Do you have any symptoms?" (e.g., fever, frequent urination, difficulty breathing, dizziness,  weakness, vomiting)     Mild SOB at HS  Protocols used: Diabetes - High Blood Sugar-A-AH

## 2024-02-16 NOTE — Telephone Encounter (Signed)
 Noted.

## 2024-02-16 NOTE — Telephone Encounter (Signed)
 Kellen please reach out to pt and see if she is able to come tomorrow for appt I have blocked 2 slots if she is able to put here in one of the spots

## 2024-02-17 ENCOUNTER — Encounter (INDEPENDENT_AMBULATORY_CARE_PROVIDER_SITE_OTHER): Payer: Self-pay | Admitting: Primary Care

## 2024-02-17 ENCOUNTER — Other Ambulatory Visit: Payer: Self-pay

## 2024-02-17 ENCOUNTER — Ambulatory Visit (INDEPENDENT_AMBULATORY_CARE_PROVIDER_SITE_OTHER): Payer: Self-pay | Admitting: Primary Care

## 2024-02-17 VITALS — BP 121/78 | HR 85 | Resp 16 | Ht 60.5 in | Wt 158.4 lb

## 2024-02-17 DIAGNOSIS — E119 Type 2 diabetes mellitus without complications: Secondary | ICD-10-CM

## 2024-02-17 DIAGNOSIS — N921 Excessive and frequent menstruation with irregular cycle: Secondary | ICD-10-CM

## 2024-02-17 DIAGNOSIS — E782 Mixed hyperlipidemia: Secondary | ICD-10-CM

## 2024-02-17 DIAGNOSIS — G514 Facial myokymia: Secondary | ICD-10-CM

## 2024-02-17 DIAGNOSIS — G4733 Obstructive sleep apnea (adult) (pediatric): Secondary | ICD-10-CM

## 2024-02-17 LAB — POCT GLYCOSYLATED HEMOGLOBIN (HGB A1C): HbA1c, POC (controlled diabetic range): 7.4 % — AB (ref 0.0–7.0)

## 2024-02-17 MED ORDER — GLIPIZIDE 10 MG PO TABS
10.0000 mg | ORAL_TABLET | Freq: Two times a day (BID) | ORAL | 1 refills | Status: DC
Start: 2024-02-17 — End: 2024-03-07
  Filled 2024-02-17: qty 180, 90d supply, fill #0

## 2024-02-17 MED ORDER — METFORMIN HCL 1000 MG PO TABS
1000.0000 mg | ORAL_TABLET | Freq: Two times a day (BID) | ORAL | 1 refills | Status: DC
Start: 2024-02-17 — End: 2024-06-01
  Filled 2024-02-17: qty 180, 90d supply, fill #0
  Filled 2024-05-23 – 2024-05-24 (×2): qty 180, 90d supply, fill #1

## 2024-02-17 NOTE — Progress Notes (Signed)
 Subjective:  Patient ID: Lindsay Hunt, female    DOB: August 16, 1986  Age: 38 y.o. MRN: [id number]  CC: Diabetes  Lindsay Hunt Hispanic female Lindsay Hunt [id number]) presents for follow-up of diabetes. Patient does  check blood sugar at home Patient voices concerns about right side of her face feeling numb and twitching (described as ants walking all over) this occurs at least 3-4 times a week.  Patient has a symmetrical smile, able to raise her eyebrows evenly, no drooping on right side of face. (D/w s/s of Bell palsy ). She also c/o dizziness 2-3 times - . She drinks 2 cups of coffee a day  (large cup), daily coke , she admits being fatigue and stressed   Compliant with meds - Yes Checking CBGs? Yes  Fasting avg - 200- 300   Postprandial average -  Exercising regularly? - No Watching carbohydrate intake? - Yes Neuropathy ? - No Hypoglycemic events - No  - Recovers with :   Pertinent ROS:  Polyuria - No Polydipsia - No Vision problems - No  Medications as noted below. Taking them regularly without complication/adverse reaction being reported today.   History  has a past medical history of Diabetes mellitus without complication (HCC), Hyperlipidemia, and Hypertension.   She has a past surgical history that includes No past surgeries.   Her family history includes Cancer in her maternal grandmother and mother; Diabetes in her brother, father, maternal grandfather, maternal grandmother, mother, paternal grandfather, paternal grandmother, and sister.She reports that she has never smoked. She has never used smokeless tobacco. She reports that she does not drink alcohol and does not use drugs.  No current outpatient medications on file prior to visit.   No current facility-administered medications on file prior to visit.    Review of Systems Comprehensive ROS Pertinent positive and negative noted in HPI   Objective:  BP 121/78   Pulse 85   Resp 16   Ht 5' 0.5" (1.537  m)   Wt 158 lb 6.4 oz (71.8 kg)   SpO2 99%   BMI 30.43 kg/m   BP Readings from Last 3 Encounters:  02/17/24 121/78  03/17/23 120/81  02/03/23 126/81    Wt Readings from Last 3 Encounters:  02/17/24 158 lb 6.4 oz (71.8 kg)  03/17/23 167 lb (75.8 kg)  02/03/23 165 lb 9.6 oz (75.1 kg)    Physical Exam Vitals reviewed.  Constitutional:      Appearance: Normal appearance. She is obese.  HENT:     Head: Normocephalic.     Right Ear: Tympanic membrane, ear canal and external ear normal.     Left Ear: Tympanic membrane, ear canal and external ear normal.     Nose: Nose normal.     Mouth/Throat:     Mouth: Mucous membranes are moist.  Eyes:     Extraocular Movements: Extraocular movements intact.     Pupils: Pupils are equal, round, and reactive to light.  Cardiovascular:     Rate and Rhythm: Normal rate.  Pulmonary:     Effort: Pulmonary effort is normal.     Breath sounds: Normal breath sounds.  Abdominal:     General: Bowel sounds are normal.     Palpations: Abdomen is soft.  Musculoskeletal:        General: Normal range of motion.     Cervical back: Normal range of motion.  Skin:    General: Skin is warm and dry.  Neurological:     Mental Status:  She is alert and oriented to person, place, and time.  Psychiatric:        Mood and Affect: Mood normal.        Behavior: Behavior normal.        Thought Content: Thought content normal.    Lab Results  Component Value Date   HGBA1C 7.4 (A) 02/17/2024   HGBA1C 6.8 02/03/2023   HGBA1C 6.6 (A) 04/12/2022    Lab Results  Component Value Date   WBC 6.4 04/12/2022   HGB 14.6 04/12/2022   HCT 41.6 04/12/2022   PLT 276 04/12/2022   GLUCOSE 124 (H) 04/12/2022   CHOL 190 04/12/2022   TRIG 180 (H) 04/12/2022   HDL 38 (L) 04/12/2022   LDLCALC 120 (H) 04/12/2022   ALT 72 (H) 04/12/2022   AST 40 04/12/2022   NA 138 04/12/2022   K 4.0 04/12/2022   CL 103 04/12/2022   CREATININE 0.54 (L) 04/12/2022   BUN 8 04/12/2022    CO2 23 04/12/2022   TSH 3.270 12/03/2018   HGBA1C 7.4 (A) 02/17/2024   Assessment & Plan:  [name] was seen today for diabetes.  Diagnoses and all orders for this visit:  Type 2 diabetes mellitus without complication, without long-term current use of insulin  (HCC) -  -     POCT glycosylated hemoglobin (Hb A1C) 7.4 -     Microalbumin / creatinine urine ratio  Mixed hyperlipidemia -     CMP14+EGFR -     Lipid panel  Menorrhagia with irregular cycle    CBC with Differential/Platelet  Myokymia of right side of face Reduce caffeine PHQ normal she is overwhelm and stress   OSA (obstructive sleep apnea)  Pulmonary tried to reach pt 3 times vm full closed referral. She continues with symptoms of daytime fatigue, morning fatigue, and morning headache. Patient generally gets 3 or 4 hours of sleep per night, and states they generally have difficulty falling asleep and difficulty falling back asleep if awakened. Snoring of moderate severity is present. Apneic episodes is present   Follow-up:  Return in about 3 months (around 05/19/2024) for DM, fasting labs.  The above assessment and management plan was discussed with the patient. The patient verbalized understanding of and has agreed to the management plan. Patient is aware to call the clinic if symptoms fail to improve or worsen. Patient is aware when to return to the clinic for a follow-up visit. Patient educated on when it is appropriate to go to the emergency department.   Madelyn Schick, NP-C

## 2024-02-17 NOTE — Patient Instructions (Addendum)
 Parlisis de [employer] adultos: qu debe saber Bell's Palsy in Adults: What to Know  La parlisis de Bell es una afeccin que causa debilidad o parlisis a corto plazo de los msculos de una parte del rostro. Con la parlisis, no se pueden mover los msculos afectados. La parlisis de Bell ocurre cuando un nervio de la cara, llamado sptimo nervio craneal, se irrita, se hincha o se ejerce presin sobre l. Este nervio se extiende a lo largo del crneo, debajo de la oreja y hasta el costado de la cara. Controla movimientos de la cara como parpadear, cerrar los ojos, Horticulturist, commercial y Development worker, community. Los sntomas de la parlisis de Bell pueden desaparecer por s solos en unas semanas. En algunos casos, es necesario [employer]. Cules son las causas? Se desconoce la causa exacta de la parlisis de Bell. Puede ser causada por irritacin e hinchazn, o por una infeccin de un virus, como el virus de la varicela (herpes zster), de Epstein-Barr o de las paperas. Qu incrementa el riesgo? Es ms probable que contraiga parlisis de Bell si: Est embarazada. Tiene diabetes. Tiene presin arterial alta. Tienen obesidad. Recientemente ha tenido una infeccin en la nariz, la garganta o las vas respiratorias superiores. Otras cosas que pueden desencadenar esta afeccin incluyen las siguientes: Un virus que ha estado en el cuerpo pero no ha estado activo. Un sistema inmunitario debilitado, que es [address] de defensa de su cuerpo. Puede estar dbil debido a lo siguiente: Estrs. No dormir lo suficiente. Un traumatismo fsico o una lesin. Una enfermedad menor. Sndromes autoinmunitarios. Una infeccin o irritacin e hinchazn del nervio facial. Dao en el recubrimiento de las fibras nerviosas. Cules son los signos o sntomas? Los sntomas de la parlisis de Bell incluyen los siguientes: Debilidad repentina en un lado del rostro. Cada del prpado, la ceja y la comisura de la boca. Babeo  de un lado de la boca. Tener dificultad para cerrar el prpado. Si vive con parlisis de Bell, tambin puede presentar lo siguiente: Dolor o sensaciones inusuales en la cara. Mucho lagrimeo en un ojo. Cambios en el sabor que les siente a las cosas. Tener sensibilidad al sonido en un odo. Dolor detrs de [employer]. Dolor alrededor de un lado de la [place]. Problemas para comer o beber. La mayor parte del [place], solo una parte del rostro se ve afectada. En casos muy poco frecuentes, la parlisis facial puede afectar todo el rostro. Cmo se diagnostica? El diagnstico de la parlisis de Bell incluir un examen fsico. Se revisarn sus sntomas y antecedentes mdicos. Tal vez tambin deba consultar a mdicos especialistas. Este puede ser un experto en trastornos nerviosos, llamado neurlogo, o un especialista en ojos, llamado oftalmlogo. Pueden hacerle estudios, por ejemplo: Una electromiografa. Con este estudio se detecta si hay dao nervioso. Pruebas de diagnstico por imgenes, como una exploracin por tomografa computarizada (TC) o una resonancia magntica (RM). Anlisis de [place]. Cmo se trata? La parlisis de Bell afecta a cada persona de [employer]. A veces, los sntomas desaparecen sin tratamiento en el plazo de unas semanas. Si el tratamiento es necesario, vara de persona a Social worker. El [place] del tratamiento es reducir la irritacin y la hinchazn, y proteger los ojos para que no se daen. El tratamiento de la parlisis facial puede incluir lo siguiente: Medicamentos, por ejemplo: Corticoesteroides para reducir la irritacin y la hinchazn. Medicamentos antivirales. Medicamentos para el dolor, como acetaminofeno, aspirina o ibuprofeno. Gotas oftlmicas o ungento para [employer].  Proteccin para los ojos, si no puede cerrar el prpado. Ejercicios de fisioterapia para mejorar la movilidad y la fuerza de los msculos de la cara. Esto tambin puede  incluir acupuntura o masajes. Siga estas instrucciones en su casa:  Tome sus medicamentos nicamente segn las indicaciones. Si el ojo est afectado: Aplique el ungento o las gotas oftlmicas para Pharmacologist los ojos humectados tal como se lo haya indicado el mdico. Siga las instrucciones de su mdico para el cuidado y la proteccin de los ojos. Haga los ejercicios de fisioterapia como se lo hayan indicado. Dnde obtener ms informacin [employer] Ophthal[employer] (AAO) (Academia Estadounidense de Oftalmologa): [email or web address].ee Comunquese con un mdico si: Tiene fiebre o siente escalofros. Los sntomas no mejoran en un lapso de 2 a 3 semanas, o los sntomas empeoran. El ojo est rojo, irritado o le duele. Aparecen nuevos sntomas. Siente que va a desvanecerse. Solicite ayuda de inmediato si: Siente debilidad o entumecimiento en alguna parte del cuerpo que no sea el rostro. Tiene dificultad para tragar. Siente dolor o rigidez en el cuello. Le falta el aire. Estos sntomas pueden Customer service manager. Llame al 911 de inmediato. No espere a ver si los sntomas desaparecen. No conduzca por sus propios medios [employer]. Esta informacin no tiene Theme park manager el consejo del mdico. Asegrese de hacerle al mdico cualquier pregunta que tenga. Document Revised: 04/14/2023 Document Reviewed: 04/14/2023 Elsevier Patient Education  2024 [employer].

## 2024-02-18 LAB — CMP14+EGFR
ALT: 56 IU/L — ABNORMAL HIGH (ref 0–32)
AST: 36 IU/L (ref 0–40)
Albumin: 4.4 g/dL (ref 3.9–4.9)
Alkaline Phosphatase: 84 IU/L (ref 44–121)
BUN/Creatinine Ratio: 17 (ref 9–23)
BUN: 9 mg/dL (ref 6–24)
Bilirubin Total: 0.2 mg/dL (ref 0.0–1.2)
CO2: 19 mmol/L — ABNORMAL LOW (ref 20–29)
Calcium: 9.1 mg/dL (ref 8.7–10.2)
Chloride: 103 mmol/L (ref 96–106)
Creatinine, Ser: 0.52 mg/dL — ABNORMAL LOW (ref 0.57–1.00)
Globulin, Total: 2.8 g/dL (ref 1.5–4.5)
Glucose: 165 mg/dL — ABNORMAL HIGH (ref 70–99)
Potassium: 3.8 mmol/L (ref 3.5–5.2)
Sodium: 135 mmol/L (ref 134–144)
Total Protein: 7.2 g/dL (ref 6.0–8.5)
eGFR: 120 mL/min/{1.73_m2} (ref >59)

## 2024-02-18 LAB — CBC WITH DIFFERENTIAL/PLATELET
Basophils Absolute: 0 10*3/uL (ref 0.0–0.2)
Basos: 0 %
EOS (ABSOLUTE): 0.2 10*3/uL (ref 0.0–0.4)
Eos: 3 %
Hematocrit: 41.1 % (ref 34.0–46.6)
Hemoglobin: 13.8 g/dL (ref 11.1–15.9)
Immature Grans (Abs): 0 10*3/uL (ref 0.0–0.1)
Immature Granulocytes: 0 %
Lymphocytes Absolute: 2.5 10*3/uL (ref 0.7–3.1)
Lymphs: 35 %
MCH: 31.2 pg (ref 26.6–33.0)
MCHC: 33.6 g/dL (ref 31.5–35.7)
MCV: 93 fL (ref 79–97)
Monocytes Absolute: 0.4 10*3/uL (ref 0.1–0.9)
Monocytes: 6 %
Neutrophils Absolute: 3.8 10*3/uL (ref 1.4–7.0)
Neutrophils: 56 %
Platelets: 282 10*3/uL (ref 150–450)
RBC: 4.42 x10E6/uL (ref 3.77–5.28)
RDW: 12.3 % (ref 11.7–15.4)
WBC: 7 10*3/uL (ref 3.4–10.8)

## 2024-02-18 LAB — LIPID PANEL
Chol/HDL Ratio: 5.8 ratio — ABNORMAL HIGH (ref 0.0–4.4)
Cholesterol, Total: 207 mg/dL — ABNORMAL HIGH (ref 100–199)
HDL: 36 mg/dL — ABNORMAL LOW (ref >39)
LDL Chol Calc (NIH): 115 mg/dL — ABNORMAL HIGH (ref 0–99)
Triglycerides: 321 mg/dL — ABNORMAL HIGH (ref 0–149)
VLDL Cholesterol Cal: 56 mg/dL — ABNORMAL HIGH (ref 5–40)

## 2024-02-18 LAB — MICROALBUMIN / CREATININE URINE RATIO
Creatinine, Urine: 31 mg/dL
Microalb/Creat Ratio: 27 mg/g{creat} (ref 0–29)
Microalbumin, Urine: 8.5 ug/mL

## 2024-02-22 ENCOUNTER — Encounter (INDEPENDENT_AMBULATORY_CARE_PROVIDER_SITE_OTHER): Payer: Self-pay | Admitting: Primary Care

## 2024-02-22 ENCOUNTER — Other Ambulatory Visit (INDEPENDENT_AMBULATORY_CARE_PROVIDER_SITE_OTHER): Payer: Self-pay | Admitting: Primary Care

## 2024-02-22 MED ORDER — PRAVASTATIN SODIUM 80 MG PO TABS
80.0000 mg | ORAL_TABLET | Freq: Every day | ORAL | 3 refills | Status: DC
  Filled 2024-02-22: qty 30, 30d supply, fill #0

## 2024-02-23 ENCOUNTER — Other Ambulatory Visit: Payer: Self-pay

## 2024-02-24 ENCOUNTER — Encounter: Payer: Self-pay | Admitting: Sleep Medicine

## 2024-02-24 ENCOUNTER — Ambulatory Visit (INDEPENDENT_AMBULATORY_CARE_PROVIDER_SITE_OTHER): Payer: Self-pay | Admitting: Sleep Medicine

## 2024-02-24 VITALS — BP 120/80 | HR 77 | Temp 99.4°F | Ht 60.0 in | Wt 162.0 lb

## 2024-02-24 DIAGNOSIS — G4733 Obstructive sleep apnea (adult) (pediatric): Secondary | ICD-10-CM

## 2024-02-24 DIAGNOSIS — E66811 Body mass index (BMI) 30.0-30.9, adult: Secondary | ICD-10-CM

## 2024-02-24 NOTE — Progress Notes (Signed)
 Name:Lindsay Hunt MRN: [id number] DOB: September 16, 1986   CHIEF COMPLAINT:  EXCESSIVE DAYTIME SLEEPINESS   HISTORY OF PRESENT ILLNESS:  Mrs. Ruthine Cowper is a 38 y.o. w/ a h/o obesity, DMII and hyperlipidemia who present for c/o loud snoring, witnessed apnea and excessive daytime sleepiness which has been present for several years. Reports nocturnal awakenings due to gasping for air, however does not have difficulty falling back to sleep. Reports a 20 lb weight gain over the last few years. Denies morning headaches, RLS symptoms, dream enactment, cataplexy, hypnagogic or hypnapompic hallucinations. Denies a family history of sleep apnea. Denies drowsy driving. Drinks 2-3 cups of coffee daily, denies alcohol, tobacco or illicit drug use.   Bedtime 11 pm Sleep onset 1 hour Rise time 6 am   EPWORTH SLEEP SCORE 13    02/24/2024    1:00 PM  Results of the Epworth flowsheet  Sitting and reading 3  Watching TV 3  Sitting, inactive in a public place (e.g. a theatre or a meeting) 2  As a passenger in a car for an hour without a break 1  Lying down to rest in the afternoon when circumstances permit 3  Sitting and talking to someone 0  Sitting quietly after a lunch without alcohol 0  In a car, while stopped for a few minutes in traffic 1  Total score 13    PAST MEDICAL HISTORY :   has a past medical history of Diabetes mellitus without complication (HCC), Hyperlipidemia, and Hypertension.  has a past surgical history that includes No past surgeries. Prior to Admission medications   Medication Sig Start Date End Date Taking? Authorizing Provider  glipiZIDE  (GLUCOTROL ) 10 MG tablet Take 1 tablet (10 mg total) by mouth 2 (two) times daily before a meal. 02/17/24   Marius Siemens, NP  metFORMIN  (GLUCOPHAGE ) 1000 MG tablet Take 1 tablet (1,000 mg total) by mouth 2 (two) times daily with a meal. 02/17/24   Marius Siemens, NP  pravastatin  (PRAVACHOL ) 80 MG tablet Take 1 tablet (80 mg  total) by mouth daily. 02/22/24   Marius Siemens, NP   No Known Allergies  FAMILY HISTORY:  family history includes Cancer in her maternal grandmother and mother; Diabetes in her brother, father, maternal grandfather, maternal grandmother, mother, paternal grandfather, paternal grandmother, and sister. SOCIAL HISTORY:  reports that she has never smoked. She has never used smokeless tobacco. She reports that she does not drink alcohol and does not use drugs.   Review of Systems:  Gen:  Denies  fever, sweats, chills weight loss  HEENT: Denies blurred vision, double vision, ear pain, eye pain, hearing loss, nose bleeds, sore throat Cardiac:  No dizziness, chest pain or heaviness, chest tightness,edema, No JVD Resp:   No cough, -sputum production, -shortness of breath,-wheezing, -hemoptysis,  Gi: Denies swallowing difficulty, stomach pain, nausea or vomiting, diarrhea, constipation, bowel incontinence Gu:  Denies bladder incontinence, burning urine Ext:   Denies Joint pain, stiffness or swelling Skin: Denies  skin rash, easy bruising or bleeding or hives Endoc:  Denies polyuria, polydipsia , polyphagia or weight change Psych:   Denies depression, insomnia or hallucinations  Other:  All other systems negative  VITAL SIGNS: BP 120/80 (BP Location: Right Arm, Patient Position: Sitting, Cuff Size: Normal)   Pulse 77   Temp 99.4 F (37.4 C) (Oral)   Ht 5' (1.524 m)   Wt 162 lb (73.5 kg)   SpO2 98%   BMI 31.64 kg/m  Physical Examination:   General Appearance: No distress  EYES PERRLA, EOM intact.   NECK Supple, No JVD Pulmonary: normal breath sounds, No wheezing.  CardiovascularNormal S1,S2.  No m/r/g.   Abdomen: Benign, Soft, non-tender. Skin:   warm, no rashes, no ecchymosis  Extremities: normal, no cyanosis, clubbing. Neuro:without focal findings,  speech normal  PSYCHIATRIC: Mood, affect within normal limits.   ASSESSMENT AND PLAN  OSA I suspect that OSA is likely  present due to clinical presentation. Discussed the consequences of untreated sleep apnea. Advised not to drive drowsy for safety of patient and others. Will complete further evaluation with a home sleep study and follow up to review results.    Obesity Counseled patient on diet and lifestyle modification.    MEDICATION ADJUSTMENTS/LABS AND TESTS ORDERED: Recommend Sleep Study   Patient  satisfied with Plan of action and management. All questions answered  Follow up to review HST results and treatment plan.   I spent a total of 30 minutes reviewing chart data, face-to-face evaluation with the patient, counseling and coordination of care as detailed above.    Judge Duque, M.D.  Sleep Medicine [facility]

## 2024-02-24 NOTE — Patient Instructions (Signed)
 Lindsay Hunt

## 2024-03-02 ENCOUNTER — Ambulatory Visit (INDEPENDENT_AMBULATORY_CARE_PROVIDER_SITE_OTHER): Payer: Self-pay | Admitting: Primary Care

## 2024-03-03 ENCOUNTER — Other Ambulatory Visit: Payer: Self-pay

## 2024-03-07 ENCOUNTER — Other Ambulatory Visit: Payer: Self-pay

## 2024-03-07 ENCOUNTER — Inpatient Hospital Stay (HOSPITAL_COMMUNITY): Payer: Self-pay

## 2024-03-07 ENCOUNTER — Encounter (HOSPITAL_COMMUNITY): Payer: Self-pay

## 2024-03-07 ENCOUNTER — Inpatient Hospital Stay (HOSPITAL_COMMUNITY)
Admission: AD | Admit: 2024-03-07 | Discharge: 2024-03-07 | Disposition: A | Discharge status: Home / Self Care | Payer: Self-pay | Attending: Family Medicine | Admitting: Family Medicine

## 2024-03-07 DIAGNOSIS — Z3491 Encounter for supervision of normal pregnancy, unspecified, first trimester: Secondary | ICD-10-CM

## 2024-03-07 DIAGNOSIS — Z3A01 Less than 8 weeks gestation of pregnancy: Secondary | ICD-10-CM

## 2024-03-07 DIAGNOSIS — R1030 Lower abdominal pain, unspecified: Secondary | ICD-10-CM | POA: Insufficient documentation

## 2024-03-07 DIAGNOSIS — K219 Gastro-esophageal reflux disease without esophagitis: Secondary | ICD-10-CM

## 2024-03-07 DIAGNOSIS — O09521 Supervision of elderly multigravida, first trimester: Secondary | ICD-10-CM | POA: Insufficient documentation

## 2024-03-07 DIAGNOSIS — O219 Vomiting of pregnancy, unspecified: Secondary | ICD-10-CM

## 2024-03-07 DIAGNOSIS — O99611 Diseases of the digestive system complicating pregnancy, first trimester: Secondary | ICD-10-CM | POA: Insufficient documentation

## 2024-03-07 DIAGNOSIS — O26891 Other specified pregnancy related conditions, first trimester: Secondary | ICD-10-CM

## 2024-03-07 DIAGNOSIS — R109 Unspecified abdominal pain: Secondary | ICD-10-CM

## 2024-03-07 LAB — COMPREHENSIVE METABOLIC PANEL WITH GFR
ALT: 33 U/L (ref 0–44)
AST: 29 U/L (ref 15–41)
Albumin: 3.4 g/dL — ABNORMAL LOW (ref 3.5–5.0)
Alkaline Phosphatase: 47 U/L (ref 38–126)
Anion gap: 8 (ref 5–15)
BUN: 7 mg/dL (ref 6–20)
CO2: 20 mmol/L — ABNORMAL LOW (ref 22–32)
Calcium: 8.9 mg/dL (ref 8.9–10.3)
Chloride: 106 mmol/L (ref 98–111)
Creatinine, Ser: 0.47 mg/dL (ref 0.44–1.00)
GFR, Estimated: >60 mL/min (ref >60)
Glucose, Bld: 117 mg/dL — ABNORMAL HIGH (ref 70–99)
Potassium: 4.3 mmol/L (ref 3.5–5.1)
Sodium: 134 mmol/L — ABNORMAL LOW (ref 135–145)
Total Bilirubin: 0.7 mg/dL (ref 0.0–1.2)
Total Protein: 6.5 g/dL (ref 6.5–8.1)

## 2024-03-07 LAB — URINALYSIS, ROUTINE W REFLEX MICROSCOPIC
Bilirubin Urine: NEGATIVE
Glucose, UA: NEGATIVE mg/dL
Hgb urine dipstick: NEGATIVE
Ketones, ur: NEGATIVE mg/dL
Leukocytes,Ua: NEGATIVE
Nitrite: NEGATIVE
Protein, ur: NEGATIVE mg/dL
Specific Gravity, Urine: 1.002 — ABNORMAL LOW (ref 1.005–1.030)
pH: 6 (ref 5.0–8.0)

## 2024-03-07 LAB — CBC
HCT: 38.1 % (ref 36.0–46.0)
Hemoglobin: 13.2 g/dL (ref 12.0–15.0)
MCH: 31.9 pg (ref 26.0–34.0)
MCHC: 34.6 g/dL (ref 30.0–36.0)
MCV: 92 fL (ref 80.0–100.0)
Platelets: 238 10*3/uL (ref 150–400)
RBC: 4.14 MIL/uL (ref 3.87–5.11)
RDW: 12.7 % (ref 11.5–15.5)
WBC: 8.2 10*3/uL (ref 4.0–10.5)
nRBC: 0 % (ref 0.0–0.2)

## 2024-03-07 LAB — WET PREP, GENITAL
Clue Cells Wet Prep HPF POC: NONE SEEN
Sperm: NONE SEEN
Trich, Wet Prep: NONE SEEN
WBC, Wet Prep HPF POC: >=10 — AB (ref <10)
Yeast Wet Prep HPF POC: NONE SEEN

## 2024-03-07 LAB — POCT PREGNANCY, URINE: Preg Test, Ur: POSITIVE — AB

## 2024-03-07 LAB — HCG, QUANTITATIVE, PREGNANCY: hCG, Beta Chain, Quant, S: 40762 m[IU]/mL — ABNORMAL HIGH (ref <5)

## 2024-03-07 MED ORDER — FAMOTIDINE 20 MG PO TABS
20.0000 mg | ORAL_TABLET | Freq: Two times a day (BID) | ORAL | 2 refills | Status: DC | PRN
  Filled 2024-03-07: qty 30, 15d supply, fill #0

## 2024-03-07 MED ORDER — LACTATED RINGERS IV BOLUS
1000.0000 mL | Freq: Once | INTRAVENOUS | Status: AC
  Administered 2024-03-07: 1000 mL via INTRAVENOUS

## 2024-03-07 MED ORDER — SODIUM CHLORIDE 0.9 % IV SOLN
12.5000 mg | Freq: Once | INTRAVENOUS | Status: AC
  Administered 2024-03-07: 12.5 mg via INTRAVENOUS
  Filled 2024-03-07: qty 12.5

## 2024-03-07 MED ORDER — FAMOTIDINE IN NACL 20-0.9 MG/50ML-% IV SOLN
20.0000 mg | Freq: Once | INTRAVENOUS | Status: AC
  Administered 2024-03-07: 20 mg via INTRAVENOUS
  Filled 2024-03-07: qty 50

## 2024-03-07 MED ORDER — PROMETHAZINE HCL 12.5 MG PO TABS
12.5000 mg | ORAL_TABLET | Freq: Four times a day (QID) | ORAL | 2 refills | Status: DC | PRN
  Filled 2024-03-07: qty 30, 8d supply, fill #0

## 2024-03-07 NOTE — MAU Note (Signed)
.  Lindsay Hunt is a 38 y.o. at Unknown here in MAU reporting: Patient reports pain in her lower back and belly, chest pain on left side reports it feels like pressure 3/10 intermittent. Reports nausea and vomiting x2. LMP: April 09/2024 Onset of complaint: 3 days ago.  Pain score: 3/10 chest pain and 8/10 abdominal pain There were no vitals filed for this visit.    Lab orders placed from triage:   ua poct preg

## 2024-03-07 NOTE — MAU Provider Note (Signed)
 Chief Complaint: Chest Pain, Nausea, and Emesis   Event Date/Time   First Provider Initiated Contact with Patient 03/07/24 1720      SUBJECTIVE HPI: Lindsay Hunt is a 38 y.o. [id number] at [redacted]w[redacted]d by LMP who presents to maternity admissions reporting nausea, vomiting, pain in her upper abdomen and chest, and lower abdominal pain x 3 days. There is also dizziness.  The pain in her low abdomen is cramping intermittent pain. The pain in her chest is intermittent, moves from upper abdomen to right and left sides of the chest at times.  There is no shortness of breath.    HPI  Past Medical History:  Diagnosis Date   Diabetes mellitus without complication (HCC)    Hyperlipidemia    Hypertension    Past Surgical History:  Procedure Laterality Date   NO PAST SURGERIES     Social History   Socioeconomic History   Marital status: Married    Spouse name: Not on file   Number of children: Not on file   Years of education: Not on file   Highest education level: Not on file  Occupational History   Not on file  Tobacco Use   Smoking status: Never   Smokeless tobacco: Never  Vaping Use   Vaping status: Never Used  Substance and Sexual Activity   Alcohol use: No   Drug use: Never   Sexual activity: Not Currently    Birth control/protection: None  Other Topics Concern   Not on file  Social History Narrative   Not on file   Social Drivers of Health   Financial Resource Strain: Not on file  Food Insecurity: No Food Insecurity (03/07/2024)   Hunger Vital Sign    Worried About Running Out of Food in the Last Year: Never true    Ran Out of Food in the Last Year: Never true  Transportation Needs: No Transportation Needs (03/07/2024)   PRAPARE - Administrator, Civil Service (Medical): No    Lack of Transportation (Non-Medical): No  Physical Activity: Not on file  Stress: Not on file  Social Connections: Not on file  Intimate Partner Violence: Not At Risk (03/07/2024)    Humiliation, Afraid, Rape, and Kick questionnaire    Fear of Current or Ex-Partner: No    Emotionally Abused: No    Physically Abused: No    Sexually Abused: No   No current facility-administered medications on file prior to encounter.   Current Outpatient Medications on File Prior to Encounter  Medication Sig Dispense Refill   glipiZIDE  (GLUCOTROL ) 10 MG tablet Take 1 tablet (10 mg total) by mouth 2 (two) times daily before a meal. (Patient taking differently: Take 10 mg by mouth daily before breakfast.) 180 tablet 1   metFORMIN  (GLUCOPHAGE ) 1000 MG tablet Take 1 tablet (1,000 mg total) by mouth 2 (two) times daily with a meal. (Patient taking differently: Take 1,000 mg by mouth daily with breakfast.) 180 tablet 1   pravastatin  (PRAVACHOL ) 80 MG tablet Take 1 tablet (80 mg total) by mouth daily. 90 tablet 3   No Known Allergies  ROS:  Review of Systems  Constitutional:  Negative for chills, fatigue and fever.  Respiratory:  Negative for shortness of breath.   Cardiovascular:  Negative for chest pain.  Gastrointestinal:  Positive for abdominal pain, nausea and vomiting.  Genitourinary:  Negative for difficulty urinating, dysuria, flank pain, pelvic pain, vaginal bleeding, vaginal discharge and vaginal pain.  Neurological:  Negative for dizziness and  headaches.  Psychiatric/Behavioral: Negative.       I have reviewed patient's Past Medical Hx, Surgical Hx, Family Hx, Social Hx, medications and allergies.   Physical Exam  Patient Vitals for the past 24 hrs:  BP Temp Pulse Resp SpO2  03/07/24 1542 (!) 118/59 98.5 F (36.9 C) 80 16 98 %   Constitutional: Well-developed, well-nourished female in no acute distress.  Cardiovascular: normal rate Respiratory: normal effort GI: Abd soft, non-tender. Pos BS x 4 MS: Extremities nontender, no edema, normal ROM Neurologic: Alert and oriented x 4.  GU: Neg CVAT.  PELVIC EXAM: deferred   LAB RESULTS Results for orders placed or  performed during the hospital encounter of 03/07/24 (from the past 24 hours)  Urinalysis, Routine w reflex microscopic -Urine, Clean Catch     Status: Abnormal   Collection Time: 03/07/24  3:30 PM  Result Value Ref Range   Color, Urine COLORLESS (A) YELLOW   APPearance CLEAR CLEAR   Specific Gravity, Urine 1.002 (L) 1.005 - 1.030   pH 6.0 5.0 - 8.0   Glucose, UA NEGATIVE NEGATIVE mg/dL   Hgb urine dipstick NEGATIVE NEGATIVE   Bilirubin Urine NEGATIVE NEGATIVE   Ketones, ur NEGATIVE NEGATIVE mg/dL   Protein, ur NEGATIVE NEGATIVE mg/dL   Nitrite NEGATIVE NEGATIVE   Leukocytes,Ua NEGATIVE NEGATIVE  Pregnancy, urine POC     Status: Abnormal   Collection Time: 03/07/24  3:31 PM  Result Value Ref Range   Preg Test, Ur POSITIVE (A) NEGATIVE  CBC     Status: None   Collection Time: 03/07/24  6:16 PM  Result Value Ref Range   WBC 8.2 4.0 - 10.5 K/uL   RBC 4.14 3.87 - 5.11 MIL/uL   Hemoglobin 13.2 12.0 - 15.0 g/dL   HCT 96.0 45.4 - 09.8 %   MCV 92.0 80.0 - 100.0 fL   MCH 31.9 26.0 - 34.0 pg   MCHC 34.6 30.0 - 36.0 g/dL   RDW 11.9 14.7 - 82.9 %   Platelets 238 150 - 400 K/uL   nRBC 0.0 0.0 - 0.2 %  hCG, quantitative, pregnancy     Status: Abnormal   Collection Time: 03/07/24  6:16 PM  Result Value Ref Range   hCG, Beta Chain, Quant, S 40,762 (H) <5 mIU/mL  Wet prep, genital     Status: Abnormal   Collection Time: 03/07/24  6:16 PM   Specimen: PATH Cytology Cervicovaginal Ancillary Only  Result Value Ref Range   Yeast Wet Prep HPF POC NONE SEEN NONE SEEN   Trich, Wet Prep NONE SEEN NONE SEEN   Clue Cells Wet Prep HPF POC NONE SEEN NONE SEEN   WBC, Wet Prep HPF POC >=10 (A) <10   Sperm NONE SEEN   Comprehensive metabolic panel     Status: Abnormal   Collection Time: 03/07/24  6:16 PM  Result Value Ref Range   Sodium 134 (L) 135 - 145 mmol/L   Potassium 4.3 3.5 - 5.1 mmol/L   Chloride 106 98 - 111 mmol/L   CO2 20 (L) 22 - 32 mmol/L   Glucose, Bld 117 (H) 70 - 99 mg/dL   BUN 7 6  - 20 mg/dL   Creatinine, Ser 5.62 0.44 - 1.00 mg/dL   Calcium  8.9 8.9 - 10.3 mg/dL   Total Protein 6.5 6.5 - 8.1 g/dL   Albumin 3.4 (L) 3.5 - 5.0 g/dL   AST 29 15 - 41 U/L   ALT 33 0 - 44 U/L  Alkaline Phosphatase 47 38 - 126 U/L   Total Bilirubin 0.7 0.0 - 1.2 mg/dL   GFR, Estimated >40 >98 mL/min   Anion gap 8 5 - 15       IMAGING No results found.  MAU Management/MDM: Orders Placed This Encounter  Procedures   Wet prep, genital   US  OB LESS THAN 14 WEEKS WITH OB TRANSVAGINAL   Urinalysis, Routine w reflex microscopic -Urine, Clean Catch   CBC   hCG, quantitative, pregnancy   Comprehensive metabolic panel   Pregnancy, urine POC   Discharge patient Discharge disposition: 01-Home or Self Care; Discharge patient date: 03/07/2024    Meds ordered this encounter  Medications   lactated ringers bolus 1,000 mL   promethazine (PHENERGAN) 12.5 mg in sodium chloride  0.9 % 50 mL IVPB   famotidine  (PEPCID ) IVPB 20 mg premix   famotidine  (PEPCID ) 20 MG tablet    Sig: Take 1 tablet (20 mg total) by mouth 2 (two) times daily as needed for heartburn or indigestion.    Dispense:  30 tablet    Refill:  2    Supervising Provider:   PRATT, TANYA S [[id number]]   promethazine (PHENERGAN) 12.5 MG tablet    Sig: Take 1 tablet (12.5 mg total) by mouth every 6 (six) hours as needed for nausea or vomiting.    Dispense:  30 tablet    Refill:  2    Supervising Provider:   PRATT, TANYA S [[id number]]    IUP noted on today's US , IV fluids antiemetics, and antacids given and pt dizziness, pain, and n/v resolved.  D/C home with return precautions.  Pt to follow up with early prenatal care. List of providers given.     ASSESSMENT 1. Nausea and vomiting during pregnancy prior to [redacted] weeks gestation   2. Gastroesophageal reflux disease without esophagitis   3. [redacted] weeks gestation of pregnancy   4. Abdominal pain during pregnancy in first trimester   5. Normal intrauterine pregnancy on prenatal ultrasound in  first trimester     PLAN Discharge home Allergies as of 03/07/2024   No Known Allergies      Medication List     STOP taking these medications    glipiZIDE  10 MG tablet Commonly known as: GLUCOTROL    pravastatin  80 MG tablet Commonly known as: PRAVACHOL        TAKE these medications    famotidine  20 MG tablet Commonly known as: Pepcid  Take 1 tablet (20 mg total) by mouth 2 (two) times daily as needed for heartburn or indigestion.   metFORMIN  1000 MG tablet Commonly known as: GLUCOPHAGE  Take 1 tablet (1,000 mg total) by mouth 2 (two) times daily with a meal. What changed: when to take this   promethazine 12.5 MG tablet Commonly known as: PHENERGAN Take 1 tablet (12.5 mg total) by mouth every 6 (six) hours as needed for nausea or vomiting.        Follow-up Information     Prenatal provider of your choice Follow up.   Why: Start prenatal care as soon as possible. See list of providers.                Arlester Bence Certified Nurse-Midwife 03/07/2024  7:57 PM

## 2024-03-07 NOTE — Discharge Instructions (Signed)
OB/Gyn Offices in [place] Area          [facility]  [address] [phone]  [facility]   [address]  [phone]  [facility] [place] [facility]       [address] [phone]            [facility] [place]     [address] [phone]          [facility] [place]   [address] [phone]  [facility]   [address], [place], [place] [address] [phone]     [facility])  [address]   [phone]     [facility]  Phone: [phone]  [facility]  Phone: [phone]  [facility] Phone: [phone]  [facility] Phone: [phone]  [facility] Phone: [phone]  [facility] Phone: [phone]  [facility]  Phone: [phone]  

## 2024-03-09 ENCOUNTER — Other Ambulatory Visit: Payer: Self-pay

## 2024-03-09 LAB — GC/CHLAMYDIA PROBE AMP (~~LOC~~) NOT AT ARMC
Chlamydia: NEGATIVE
Comment: NEGATIVE
Comment: NORMAL
Neisseria Gonorrhea: NEGATIVE

## 2024-03-12 ENCOUNTER — Other Ambulatory Visit: Payer: Self-pay

## 2024-04-15 ENCOUNTER — Ambulatory Visit: Payer: Self-pay | Admitting: *Deleted

## 2024-04-15 VITALS — BP 113/75 | HR 76 | Wt 160.8 lb

## 2024-04-15 DIAGNOSIS — O0941 Supervision of pregnancy with grand multiparity, first trimester: Secondary | ICD-10-CM

## 2024-04-15 DIAGNOSIS — O0990 Supervision of high risk pregnancy, unspecified, unspecified trimester: Secondary | ICD-10-CM | POA: Insufficient documentation

## 2024-04-15 DIAGNOSIS — Z1331 Encounter for screening for depression: Secondary | ICD-10-CM

## 2024-04-15 DIAGNOSIS — Z3A12 12 weeks gestation of pregnancy: Secondary | ICD-10-CM

## 2024-04-15 NOTE — Progress Notes (Signed)
 New OB Intake  I connected with Lindsay Hunt  on 04/15/24 at  1:10 PM EDT by In Person Visit and verified that I am speaking with the correct person using two identifiers. Nurse is located at [facility] and pt is located at [facility].  I discussed the limitations, risks, security and privacy concerns of performing an evaluation and management service by telephone and the availability of in person appointments. I also discussed with the patient that there may be a patient responsible charge related to this service. The patient expressed understanding and agreed to proceed.  I explained I am completing New OB Intake today. We discussed EDD of 10/30/2024, by Last Menstrual Period. Pt is [id number]. I reviewed her allergies, medications and Medical/Surgical/OB history.    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 Discussed the nature of our practice with multiple providers including residents and students. Due to the size of the practice, the delivering provider may not be the same as those providing prenatal care.   Patient is not interested in water birth.  MyChart/Babyscripts MyChart access verified. I explained pt will have some visits in office and some virtually. Babyscripts instructions given and order placed. Patient verifies receipt of registration text/e-mail. Account successfully created and app downloaded. If patient is a candidate for Optimized scheduling, add to sticky note.   Blood Pressure Cuff/Weight Scale Patient is self-pay; explained patient will be given BP cuff at first prenatal appt. Explained after first prenatal appt pt will check weekly and document in Babyscripts.  Patient does not have weight scale; patient may purchase if they desire to track weight weekly in Babyscripts.  Anatomy US  Explained first scheduled US  will be around 19 weeks. Anatomy US  scheduled for TBD at TBD.  Is patient a candidate for Babyscripts Optimization? No, due to [employer].   First visit review I reviewed new OB appt with patient. Explained pt will be seen by [name], CNM at first visit. Discussed [name] genetic screening with patient. Requests [facility] and [facility].. Routine prenatal labs collected at today's visit.   Last Pap Diagnosis  Date Value Ref Range Status  03/17/2023   Final   - Negative for intraepithelial lesion or malignancy (NILM)    Lindsay CHRISTELLA Ober, RN 04/15/2024  1:15 PM

## 2024-04-15 NOTE — Patient Instructions (Signed)
[facility] has a partnership with the [employer] [facility] to provide prenatal navigation for the most needed resources in our community. In order to see how we can help connect you to these resources we need consent to contact you. Please complete the very short consent using the link below:   English Link: [email or web address].[email or web address]?[email or web address]  Spanish Link: [email or web address].[email or web address]?[email or web address]  

## 2024-04-15 NOTE — Progress Notes (Signed)
 Pt informed that the ultrasound is considered a limited OB ultrasound and is not intended to be a complete ultrasound exam.  Patient also informed that the ultrasound is not being completed with the intent of assessing for fetal or placental anomalies or any pelvic abnormalities.  Explained that the purpose of today's ultrasound is to assess for fetal heart rate. Patient acknowledges the purpose of the exam and the limitations of the study.    Fetal cardiac activity and frequent fetal movement observed. Fetal appearance and informal CRL (6.04 cm) C/W expected GA.

## 2024-04-16 LAB — COMPREHENSIVE METABOLIC PANEL WITH GFR
ALT: 45 IU/L — ABNORMAL HIGH (ref 0–32)
AST: 35 IU/L (ref 0–40)
Albumin: 4.3 g/dL (ref 3.9–4.9)
Alkaline Phosphatase: 57 IU/L (ref 44–121)
BUN/Creatinine Ratio: 12 (ref 9–23)
BUN: 6 mg/dL (ref 6–24)
Bilirubin Total: 0.2 mg/dL (ref 0.0–1.2)
CO2: 17 mmol/L — ABNORMAL LOW (ref 20–29)
Calcium: 9.3 mg/dL (ref 8.7–10.2)
Chloride: 102 mmol/L (ref 96–106)
Creatinine, Ser: 0.51 mg/dL — ABNORMAL LOW (ref 0.57–1.00)
Globulin, Total: 2.7 g/dL (ref 1.5–4.5)
Glucose: 117 mg/dL — ABNORMAL HIGH (ref 70–99)
Potassium: 3.9 mmol/L (ref 3.5–5.2)
Sodium: 137 mmol/L (ref 134–144)
Total Protein: 7 g/dL (ref 6.0–8.5)
eGFR: 121 mL/min/1.73 (ref >59)

## 2024-04-16 LAB — CBC/D/PLT+RPR+RH+ABO+RUBIGG...
Antibody Screen: NEGATIVE
Basophils Absolute: 0 x10E3/uL (ref 0.0–0.2)
Basos: 0 %
EOS (ABSOLUTE): 0.2 x10E3/uL (ref 0.0–0.4)
Eos: 2 %
HCV Ab: NONREACTIVE
HIV Screen 4th Generation wRfx: NONREACTIVE
Hematocrit: 39.3 % (ref 34.0–46.6)
Hemoglobin: 13.2 g/dL (ref 11.1–15.9)
Hepatitis B Surface Ag: NEGATIVE
Immature Grans (Abs): 0 x10E3/uL (ref 0.0–0.1)
Immature Granulocytes: 0 %
Lymphocytes Absolute: 1.5 x10E3/uL (ref 0.7–3.1)
Lymphs: 20 %
MCH: 32.2 pg (ref 26.6–33.0)
MCHC: 33.6 g/dL (ref 31.5–35.7)
MCV: 96 fL (ref 79–97)
Monocytes Absolute: 0.5 x10E3/uL (ref 0.1–0.9)
Monocytes: 6 %
Neutrophils Absolute: 5.4 x10E3/uL (ref 1.4–7.0)
Neutrophils: 72 %
Platelets: 258 x10E3/uL (ref 150–450)
RBC: 4.1 x10E6/uL (ref 3.77–5.28)
RDW: 12.5 % (ref 11.7–15.4)
RPR Ser Ql: NONREACTIVE
Rh Factor: POSITIVE
Rubella Antibodies, IGG: <0.9 {index} — ABNORMAL LOW (ref >0.99)
WBC: 7.5 x10E3/uL (ref 3.4–10.8)

## 2024-04-16 LAB — HCV INTERPRETATION

## 2024-04-18 LAB — URINE CULTURE, OB REFLEX

## 2024-04-18 LAB — CULTURE, OB URINE

## 2024-04-19 ENCOUNTER — Ambulatory Visit: Payer: Self-pay | Admitting: Obstetrics and Gynecology

## 2024-04-19 DIAGNOSIS — O0990 Supervision of high risk pregnancy, unspecified, unspecified trimester: Secondary | ICD-10-CM

## 2024-04-22 LAB — PANORAMA PRENATAL TEST FULL PANEL:PANORAMA TEST PLUS 5 ADDITIONAL MICRODELETIONS: FETAL FRACTION: 8.1

## 2024-04-26 LAB — HORIZON CUSTOM: REPORT SUMMARY: NEGATIVE

## 2024-04-27 ENCOUNTER — Encounter: Payer: Self-pay | Admitting: Advanced Practice Midwife

## 2024-04-27 ENCOUNTER — Ambulatory Visit: Payer: Self-pay | Admitting: Advanced Practice Midwife

## 2024-04-27 VITALS — BP 112/71 | HR 80 | Wt 159.2 lb

## 2024-04-27 DIAGNOSIS — O24112 Pre-existing diabetes mellitus, type 2, in pregnancy, second trimester: Secondary | ICD-10-CM

## 2024-04-27 DIAGNOSIS — Z3A14 14 weeks gestation of pregnancy: Secondary | ICD-10-CM

## 2024-04-27 DIAGNOSIS — O09522 Supervision of elderly multigravida, second trimester: Secondary | ICD-10-CM

## 2024-04-27 DIAGNOSIS — O09529 Supervision of elderly multigravida, unspecified trimester: Secondary | ICD-10-CM | POA: Insufficient documentation

## 2024-04-27 DIAGNOSIS — E119 Type 2 diabetes mellitus without complications: Secondary | ICD-10-CM

## 2024-04-27 DIAGNOSIS — O0990 Supervision of high risk pregnancy, unspecified, unspecified trimester: Secondary | ICD-10-CM

## 2024-04-27 MED ORDER — INSULIN GLARGINE 100 UNIT/ML ~~LOC~~ SOLN
10.0000 [IU] | Freq: Every day | SUBCUTANEOUS | 1 refills | Status: DC
Start: 2024-04-27 — End: 2024-04-28
  Filled 2024-04-27: qty 10, 100d supply, fill #0

## 2024-04-27 MED ORDER — TRUEPLUS 5-BEVEL PEN NEEDLES 31G X 8 MM MISC
1.0000 | Freq: Every day | 3 refills | Status: DC
Start: 2024-04-27 — End: 2024-08-25
  Filled 2024-04-27: qty 100, 100d supply, fill #0
  Filled 2024-05-11 – 2024-05-23 (×2): qty 100, 100d supply, fill #1
  Filled 2024-07-22 – 2024-08-25 (×4): qty 100, 100d supply, fill #2

## 2024-04-27 NOTE — Progress Notes (Signed)
 Pt presents for NOB visit. Pt has concerns weight loss during pregnancy.

## 2024-04-27 NOTE — Progress Notes (Signed)
 Subjective:   Lindsay Hunt is a 38 y.o. H4E6986 at [redacted]w[redacted]d by LMP being seen today for her first obstetrical visit.  Her obstetrical history is significant for AMA, vaginal delivery x 3, T2DM and has Missed abortion; Hair loss; Fatigue; Elevated lipoprotein(a); Type 2 diabetes mellitus without complication, without long-term current use of insulin  (HCC); Supervision of high risk pregnancy, antepartum; and Antepartum multigravida of advanced maternal age on their problem list.. Patient does intend to breast feed. Pregnancy history fully reviewed.  Patient reports no complaints.  HISTORY: OB History  Gravida Para Term Preterm AB Living  5 3 3  0 1 3  SAB IAB Ectopic Multiple Live Births  1 0 0 0 3    # Outcome Date GA Lbr Len/2nd Weight Sex Type Anes PTL Lv  5 Current           4 SAB 2019 [redacted]w[redacted]d         3 Term 10/03/06    F Vag-Spont   LIV  2 Term 10/11/02    M Vag-Spont   LIV  1 Term 07/03/99    F Vag-Spont   LIV   Past Medical History:  Diagnosis Date   Diabetes mellitus without complication (HCC)    Hyperlipidemia    Hypertension    Past Surgical History:  Procedure Laterality Date   NO PAST SURGERIES     Family History  Problem Relation Age of Onset   Diabetes Mother    Cancer Mother    Diabetes Father    Diabetes Sister    Diabetes Brother    Diabetes Maternal Grandmother    Cancer Maternal Grandmother    Diabetes Maternal Grandfather    Diabetes Paternal Grandmother    Diabetes Paternal Grandfather    Social History   Tobacco Use   Smoking status: Never   Smokeless tobacco: Never  Vaping Use   Vaping status: Never Used  Substance Use Topics   Alcohol use: No   Drug use: Never   No Known Allergies Current Outpatient Medications on File Prior to Visit  Medication Sig Dispense Refill   metFORMIN  (GLUCOPHAGE ) 1000 MG tablet Take 1 tablet (1,000 mg total) by mouth 2 (two) times daily with a meal. 180 tablet 1   famotidine  (PEPCID ) 20 MG tablet Take 1  tablet (20 mg total) by mouth 2 (two) times daily as needed for heartburn or indigestion. (Patient not taking: Reported on 04/27/2024) 30 tablet 2   promethazine  (PHENERGAN ) 12.5 MG tablet Take 1 tablet (12.5 mg total) by mouth every 6 (six) hours as needed for nausea or vomiting. (Patient not taking: Reported on 04/27/2024) 30 tablet 2   No current facility-administered medications on file prior to visit.    Exam   Vitals:   04/27/24 1330  BP: 112/71  Pulse: 80  Weight: 159 lb 3.2 oz (72.2 kg)   Fetal Heart Rate (bpm): 143 VS reviewed, nursing note reviewed,  Constitutional: well developed, well nourished, no distress HEENT: normocephalic CV: normal rate Pulm/chest wall: normal effort Abdomen: soft Neuro: alert and oriented x 3 Skin: warm, dry Psych: affect normal     Assessment:   Pregnancy: [id number] Patient Active Problem List   Diagnosis Date Noted   Antepartum multigravida of advanced maternal age 10/29/2023   Supervision of high risk pregnancy, antepartum 04/15/2024   Hair loss 12/03/2018   Fatigue 12/03/2018   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 Supervision of high risk pregnancy, antepartum (Primary) --Anticipatory guidance about next visits/weeks of pregnancy given.  --Reviewed pt recent labs, in our office and at PCP.  Overall, normal for pregnancy, RNI so MMR PP, and I recommend repeating cholesterol after postpartum (and preferably after breastfeeding).  Continue lifestyle changes as recommended by PCP.  2. Type 2 diabetes mellitus without complication, without long-term current use of insulin  (HCC) --fastings 5 out of 5 elevated, highest 130s.  PP lunches all wnl, but breakfast and supper all but 1 or 2 elevated, highest 180s.   --Pt taking Metformin  1000 mg in the am only. Rx for 1000 mg BID but pt has GI side  effects and is concerned about high dose in pregnancy. --Reviewed safety of medication and discussed insulin  as an alternative Pt interested in insulin . --After discussing with diabetes management team Jesusa Rasch, NP) about options, pt prefers to initiate once daily Lantus .  Rx sent and coupon info for $35/month. Pt to contact office if unable to pick up insulin . Continue Metformin  until you start insulin , then discontinue oral medication for now.    3. Antepartum multigravida of advanced maternal age   61. [redacted] weeks gestation of pregnancy   Initial labs reviewed.  Continue prenatal vitamins. NIPS: results reviewed. Ultrasound discussed; fetal anatomic survey: ordered. Problem list reviewed and updated. The nature of [facility] with multiple MDs and other Advanced Practice Providers was explained to patient; also emphasized that residents, students are part of our team. Routine obstetric precautions reviewed. Return in about 4 weeks (around 05/25/2024) for HROB, Any provider.   [name], CNM 04/27/24 2:51 PM

## 2024-04-28 ENCOUNTER — Telehealth: Payer: Self-pay

## 2024-04-28 ENCOUNTER — Other Ambulatory Visit: Payer: Self-pay

## 2024-04-28 DIAGNOSIS — E119 Type 2 diabetes mellitus without complications: Secondary | ICD-10-CM

## 2024-04-28 MED ORDER — BASAGLAR KWIKPEN 100 UNIT/ML ~~LOC~~ SOPN
10.0000 [IU] | PEN_INJECTOR | Freq: Every day | SUBCUTANEOUS | 1 refills | Status: DC
  Filled 2024-04-28: qty 3, 30d supply, fill #0
  Filled 2024-05-11 – 2024-05-24 (×3): qty 3, 30d supply, fill #1

## 2024-04-28 NOTE — Telephone Encounter (Signed)
 Call placed to patient using interpreter [phone]. RN advised that a new order for an insulin  medication has been placed, in hopes of it being a covered expense. Also advised that she should eat a high protein snack at bedtime when she takes the insulin  (cheese, yogurt, peanut butter). Pt verbalized understanding.

## 2024-05-04 ENCOUNTER — Other Ambulatory Visit: Payer: Self-pay

## 2024-05-11 ENCOUNTER — Other Ambulatory Visit: Payer: Self-pay

## 2024-05-11 ENCOUNTER — Ambulatory Visit (INDEPENDENT_AMBULATORY_CARE_PROVIDER_SITE_OTHER): Payer: Self-pay | Admitting: Obstetrics & Gynecology

## 2024-05-11 ENCOUNTER — Ambulatory Visit: Payer: Self-pay

## 2024-05-11 VITALS — BP 116/74 | HR 71 | Wt 171.0 lb

## 2024-05-11 DIAGNOSIS — E119 Type 2 diabetes mellitus without complications: Secondary | ICD-10-CM

## 2024-05-11 DIAGNOSIS — Z3A16 16 weeks gestation of pregnancy: Secondary | ICD-10-CM

## 2024-05-11 DIAGNOSIS — O0990 Supervision of high risk pregnancy, unspecified, unspecified trimester: Secondary | ICD-10-CM

## 2024-05-11 DIAGNOSIS — O09529 Supervision of elderly multigravida, unspecified trimester: Secondary | ICD-10-CM

## 2024-05-11 NOTE — Progress Notes (Signed)
   PRENATAL VISIT NOTE  Subjective:  Lindsay Hunt is a 38 y.o. H4E6986 at [redacted]w[redacted]d being seen today for ongoing prenatal care.  She is currently monitored for the following issues for this high-risk pregnancy and has Hair loss; Elevated lipoprotein(a); Type 2 diabetes mellitus without complication, without long-term current use of insulin  (HCC); Supervision of high risk pregnancy, antepartum; and Antepartum multigravida of advanced maternal age on their problem list.  Patient reports BG not in range and she needs instruction in insulin  administration.  Contractions: Not present. Vag. Bleeding: None.   . Denies leaking of fluid.   The following portions of the patient's history were reviewed and updated as appropriate: allergies, current medications, past family history, past medical history, past social history, past surgical history and problem list.   Objective:    Vitals:   05/11/24 1337  BP: 116/74  Pulse: 71  Weight: 171 lb (77.6 kg)    Fetal Status:  Fetal Heart Rate (bpm): 154        General: Alert, oriented and cooperative. Patient is in no acute distress.  Skin: Skin is warm and dry. No rash noted.   Cardiovascular: Normal heart rate noted  Respiratory: Normal respiratory effort, no problems with respiration noted  Abdomen: Soft, gravid, appropriate for gestational age.  Pain/Pressure: Absent     Pelvic: Cervical exam deferred        Extremities: Normal range of motion.  Edema: None  Mental Status: Normal mood and affect. Normal behavior. Normal judgment and thought content.   Assessment and Plan:  Pregnancy: H4E6986 at [redacted]w[redacted]d 1. Supervision of high risk pregnancy, antepartum (Primary) screening - AFP, Serum, Open Spina Bifida  2. [redacted] weeks gestation of pregnancy  - AFP, Serum, Open Spina Bifida - Ambulatory referral to Nutrition and Diabetic Education  3. Type 2 diabetes mellitus without complication, without long-term current use of insulin  (HCC) FBS 100-109  and PP up to 180 - Ambulatory referral to Nutrition and Diabetic Education  4. Antepartum multigravida of advanced maternal age US  anatomy  Preterm labor symptoms and general obstetric precautions including but not limited to vaginal bleeding, contractions, leaking of fluid and fetal movement were reviewed in detail with the patient. Please refer to After Visit Summary for other counseling recommendations.   Return in about 3 weeks (around 06/01/2024).  Future Appointments  Date Time Provider Department Center  05/13/2024  2:15 PM [facility]  05/25/2024  1:30 PM [place], Olam LABOR, CNM [facility] None    Lynwood Solomons, MD

## 2024-05-12 NOTE — Progress Notes (Unsigned)
 Patient was seen for Pre-existing Diabetes During Pregnancy on 05/13/2024   Start time 1400  and End time 1504   Estimated due date: 10/26/2023;[redacted]w[redacted]d  Spanish Interpreter from [employer]; [phone]  Clinical: Medications:  Current Outpatient Medications:    Insulin  Glargine (BASAGLAR  KWIKPEN) 100 UNIT/ML, Inject 10 Units into the skin at bedtime., Disp: 15 mL, Rfl: 1   famotidine  (PEPCID ) 20 MG tablet, Take 1 tablet (20 mg total) by mouth 2 (two) times daily as needed for heartburn or indigestion. (Patient not taking: Reported on 04/27/2024), Disp: 30 tablet, Rfl: 2   Insulin  Pen Needle (TRUEPLUS 5-BEVEL PEN NEEDLES) 31G X 8 MM MISC, use as directed, Disp: 100 each, Rfl: 3   metFORMIN  (GLUCOPHAGE ) 1000 MG tablet, Take 1 tablet (1,000 mg total) by mouth 2 (two) times daily with a meal. (Patient not taking: Reported on 05/13/2024), Disp: 180 tablet, Rfl: 1   promethazine  (PHENERGAN ) 12.5 MG tablet, Take 1 tablet (12.5 mg total) by mouth every 6 (six) hours as needed for nausea or vomiting. (Patient not taking: Reported on 04/27/2024), Disp: 30 tablet, Rfl: 2  Medical History:  Past Medical History:  Diagnosis Date   Diabetes mellitus without complication (HCC)    Hyperlipidemia    Hypertension     Labs: OGTT: none Lab Results  Component Value Date   HGBA1C 7.4 (A) 02/17/2024   Dietary and Lifestyle History: Pt presents today alone with blood sugar log sheet. Pt reports prior to pregnancy her blood sugar were ranging 260-300 mg/dL Pt reports she is taking 10 units near 11p-12a every evening. Pt reports she is testing her blood sugar three times daily upon waking and 2 hours after meals. Pt reports a decrease in appetite most recently and states she does not eat lunch currently. Pt reports her blood sugar strips are expensive and she is paying out of pocket with her orange card discount. Pt reports applying for financial assistance and awaiting approval. Pt reports she is currently a  home maker.  Pt reports she does the cooking and shopping for a family of four. Pt reports a plan to start walking. Pt reports she has made the following changes including decreasing intake of coffee and omitting sugary sweetened beverages. All Pt's questions were answered during this encounter.   Physical Activity: ADL's Stress: 10 out of 10 / self care includes: think positive, go for walk Sleep: poor 5-6 hours nightly  24 hr Recall:  First Meal:  eggs, coffee with ~ 2 tbsp milk, water, 1 flour tortilla (139 mg/dL, reported as 2 hour post prandial per Pt) Snack:  none Second meal:  skips  Snack:  none Third meal: chorizo, ~1 cup pumpkin, water, diet soda (160 mg/dL reported as 2 hour post prandial per Pt) Snack: 1/2 orange and 1 cup melon Beverages:  water, coffee with ~ 2 tbsp of milk, diet soda  NUTRITION INTERVENTION  Nutrition education (E-1) on the following topics:   Initial Follow-up  [x]  []  Why dietary management is important in controlling blood glucose [x]  []  Effects each nutrient has on blood glucose levels [x]  []  Simple carbohydrates vs complex carbohydrates [x]  []  Fluid intake [x]  []  Creating a balanced meal plan [x]  []  Carbohydrate counting  [x]  []  When to check blood glucose levels [x]  []  Proper blood glucose monitoring techniques [x]  []  Effect of stress and stress reduction techniques  [x]  []  Exercise effect on blood glucose levels, appropriate exercise during pregnancy [x]  []  Importance of limiting caffeine and abstaining from alcohol and  smoking [x]  []  Medications used for blood sugar control during pregnancy [x]  []  Hypoglycemia and rule of 15 [x]  []  Postpartum self care   Patient has a meter prior to visit. Patient is instructed to testing pre breakfast and 2 hours after each meal.   Patient instructed to monitor glucose levels: QID FBS: 60 - <= 95 mg/dL; 2 hour: <= 879 mg/dL  Patient received handouts: Spanish  Nutrition Label Blood Sugar testing  instructions Carbohydrate Counting List Blood glucose log Snack ideas for diabetes during pregnancy Plate Planner  Patient will be seen for follow-up: 06/10/2024

## 2024-05-13 ENCOUNTER — Other Ambulatory Visit: Payer: Self-pay

## 2024-05-13 ENCOUNTER — Encounter: Payer: Self-pay | Attending: Obstetrics & Gynecology | Admitting: Dietician

## 2024-05-13 ENCOUNTER — Ambulatory Visit (INDEPENDENT_AMBULATORY_CARE_PROVIDER_SITE_OTHER): Payer: Self-pay | Admitting: Dietician

## 2024-05-13 DIAGNOSIS — O24112 Pre-existing diabetes mellitus, type 2, in pregnancy, second trimester: Secondary | ICD-10-CM | POA: Insufficient documentation

## 2024-05-13 DIAGNOSIS — Z713 Dietary counseling and surveillance: Secondary | ICD-10-CM | POA: Insufficient documentation

## 2024-05-13 DIAGNOSIS — Z3A16 16 weeks gestation of pregnancy: Secondary | ICD-10-CM | POA: Insufficient documentation

## 2024-05-13 DIAGNOSIS — Z7984 Long term (current) use of oral hypoglycemic drugs: Secondary | ICD-10-CM | POA: Insufficient documentation

## 2024-05-13 DIAGNOSIS — Z794 Long term (current) use of insulin: Secondary | ICD-10-CM | POA: Insufficient documentation

## 2024-05-13 DIAGNOSIS — E119 Type 2 diabetes mellitus without complications: Secondary | ICD-10-CM

## 2024-05-13 DIAGNOSIS — O09522 Supervision of elderly multigravida, second trimester: Secondary | ICD-10-CM | POA: Insufficient documentation

## 2024-05-13 LAB — AFP, SERUM, OPEN SPINA BIFIDA
AFP MoM: 0.77
AFP Value: 23.5 ng/mL
Gest. Age on Collection Date: 16 wk
Maternal Age At EDD: 40.8 a
OSBR Risk 1 IN: 10000
Test Results:: NEGATIVE
Weight: 171 [lb_av]

## 2024-05-24 ENCOUNTER — Other Ambulatory Visit: Payer: Self-pay

## 2024-05-25 ENCOUNTER — Encounter: Payer: Self-pay | Admitting: Advanced Practice Midwife

## 2024-05-27 ENCOUNTER — Other Ambulatory Visit: Payer: Self-pay

## 2024-05-27 NOTE — Progress Notes (Deleted)
 Patient was seen for Pre-existing Diabetes During Pregnancy on ***   Start time *** and End time ***   Estimated due date: 10/26/2023;***w***d  Research officer, trade union from [employer]; #***  Clinical: Medications:  Current Outpatient Medications:    famotidine (PEPCID) 20 MG tablet, Take 1 tablet (20 mg total) by mouth 2 (two) times daily as needed for heartburn or indigestion. (Patient not taking: Reported on 04/27/2024), Disp: 30 tablet, Rfl: 2   Insulin Glargine (BASAGLAR KWIKPEN) 100 UNIT/ML, Inject 10 Units into the skin at bedtime., Disp: 15 mL, Rfl: 1   Insulin Pen Needle (TRUEPLUS 5-BEVEL PEN NEEDLES) 31G X 8 MM MISC, use as directed, Disp: 100 each, Rfl: 3   metFORMIN (GLUCOPHAGE) 1000 MG tablet, Take 1 tablet (1,000 mg total) by mouth 2 (two) times daily with a meal. (Patient not taking: Reported on 05/13/2024), Disp: 180 tablet, Rfl: 1   promethazine (PHENERGAN) 12.5 MG tablet, Take 1 tablet (12.5 mg total) by mouth every 6 (six) hours as needed for nausea or vomiting. (Patient not taking: Reported on 04/27/2024), Disp: 30 tablet, Rfl: 2  Medical History:  Past Medical History:  Diagnosis Date   Diabetes mellitus without complication (HCC)    Hyperlipidemia    Hypertension     Labs: OGTT: none Lab Results  Component Value Date   HGBA1C 7.4 (A) 02/17/2024   Dietary and Lifestyle History: *** Pt presents today alone with blood sugar log sheet.  Pt reports prior to pregnancy her blood sugar were ranging 260-300 mg/dL Pt reports she is taking 10 units near 11p-12a every evening. Pt reports she is testing her blood sugar three times daily upon waking and 2 hours after meals. Pt reports a decrease in appetite most recently and states she does not eat lunch currently.  Pt reports her blood sugar strips are expensive and she is paying out of pocket with her [employer] card discount. Pt reports applying for financial assistance and awaiting approval. Pt reports she is currently a  home maker.  Pt reports she does the cooking and shopping for a family of four. Pt reports a plan to start walking. Pt reports she has made the following changes including decreasing intake of coffee and omitting sugary sweetened beverages. All Pt's questions were answered during this encounter.   Physical Activity: ADL's Stress: 10 out of 10 / self care includes: think positive, go for walk Sleep: poor 5-6 hours nightly  24 hr Recall:  First Meal: *** eggs, coffee with ~ 2 tbsp milk, water, 1 flour tortilla (139 mg/dL, reported as 2 hour post prandial per Pt) Snack:  none Second meal: *** skips  Snack:  none Third meal: *** chorizo, ~1 cup pumpkin, water, diet soda (160 mg/dL reported as 2 hour post prandial per Pt) Snack: 1/2 orange and 1 cup melon Beverages:  water, coffee with ~ 2 tbsp of milk, diet soda  NUTRITION INTERVENTION  Nutrition education (E-1) on the following topics:   Initial Follow-up  [x]  []  Why dietary management is important in controlling blood glucose [x]  []  Effects each nutrient has on blood glucose levels [x]  []  Simple carbohydrates vs complex carbohydrates [x]  []  Fluid intake [x]  []  Creating a balanced meal plan [x]  []  Carbohydrate counting  [x]  []  When to check blood glucose levels [x]  []  Proper blood glucose monitoring techniques [x]  []  Effect of stress and stress reduction techniques  [x]  []  Exercise effect on blood glucose levels, appropriate exercise during pregnancy [x]  []  Importance of limiting caffeine and abstaining  from alcohol and smoking [x]  []  Medications used for blood sugar control during pregnancy [x]  []  Hypoglycemia and rule of 15 [x]  []  Postpartum self care   Patient has a meter prior to visit. Patient is instructed to testing pre breakfast and 2 hours after each meal.   Patient instructed to monitor glucose levels: QID FBS: 60 - <= 95 mg/dL; 2 hour: <= 879 mg/dL  Patient received handouts: Spanish  Nutrition Label Blood Sugar  testing instructions Carbohydrate Counting List Blood glucose log Snack ideas for diabetes during pregnancy Plate Planner  Patient will be seen for follow-up: 06/10/2024

## 2024-06-01 ENCOUNTER — Other Ambulatory Visit: Payer: Self-pay

## 2024-06-01 ENCOUNTER — Encounter: Payer: Self-pay | Admitting: Advanced Practice Midwife

## 2024-06-01 ENCOUNTER — Other Ambulatory Visit (HOSPITAL_COMMUNITY)
Admission: RE | Admit: 2024-06-01 | Discharge: 2024-06-01 | Disposition: A | Discharge status: Home / Self Care | Payer: Self-pay | Source: Ambulatory Visit | Attending: Advanced Practice Midwife | Admitting: Advanced Practice Midwife

## 2024-06-01 ENCOUNTER — Ambulatory Visit: Payer: Self-pay | Admitting: Advanced Practice Midwife

## 2024-06-01 VITALS — BP 116/75 | HR 83 | Wt 164.0 lb

## 2024-06-01 DIAGNOSIS — E119 Type 2 diabetes mellitus without complications: Secondary | ICD-10-CM

## 2024-06-01 DIAGNOSIS — Z3A19 19 weeks gestation of pregnancy: Secondary | ICD-10-CM

## 2024-06-01 DIAGNOSIS — O09529 Supervision of elderly multigravida, unspecified trimester: Secondary | ICD-10-CM

## 2024-06-01 DIAGNOSIS — O0990 Supervision of high risk pregnancy, unspecified, unspecified trimester: Secondary | ICD-10-CM

## 2024-06-01 DIAGNOSIS — N898 Other specified noninflammatory disorders of vagina: Secondary | ICD-10-CM

## 2024-06-01 MED ORDER — ASPIRIN 81 MG PO TBEC
81.0000 mg | DELAYED_RELEASE_TABLET | Freq: Every day | ORAL | 5 refills | Status: DC
  Filled 2024-06-01: qty 30, 30d supply, fill #0
  Filled 2024-07-22: qty 30, 30d supply, fill #1
  Filled 2024-08-24 (×2): qty 30, 30d supply, fill #2
  Filled 2024-09-22 – 2024-10-11 (×2): qty 30, 30d supply, fill #3

## 2024-06-01 MED ORDER — BASAGLAR KWIKPEN 100 UNIT/ML ~~LOC~~ SOPN
PEN_INJECTOR | SUBCUTANEOUS | 1 refills | Status: DC
Start: 2024-06-01 — End: 2024-06-29
  Filled 2024-06-01: qty 12, 34d supply, fill #0

## 2024-06-01 NOTE — Progress Notes (Signed)
 Pt presents for ROB visit. C/o yellow vaginal discharge

## 2024-06-01 NOTE — Patient Instructions (Addendum)
 Call Maternal Fetal Medicine for cancellations and sooner appointment: [phone]

## 2024-06-01 NOTE — Progress Notes (Addendum)
   PRENATAL VISIT NOTE  Subjective:  Lindsay Hunt is a 38 y.o. H4E6986 at [redacted]w[redacted]d being seen today for ongoing prenatal care.  She is currently monitored for the following issues for this high-risk pregnancy and has Hair loss; Elevated lipoprotein(a); Type 2 diabetes mellitus without complication, without long-term current use of insulin  (HCC); Supervision of high risk pregnancy, antepartum; and Antepartum multigravida of advanced maternal age on their problem list.  Patient reports no complaints.  Contractions: Not present. Vag. Bleeding: None.  Movement: Absent. Denies leaking of fluid.   The following portions of the patient's history were reviewed and updated as appropriate: allergies, current medications, past family history, past medical history, past social history, past surgical history and problem list.   Objective:    Vitals:   06/01/24 1028  BP: 116/75  Pulse: 83  Weight: 74.4 kg    Fetal Status:      Movement: Absent    General: Alert, oriented and cooperative. Patient is in no acute distress.  Skin: Skin is warm and dry. No rash noted.   Cardiovascular: Normal heart rate noted  Respiratory: Normal respiratory effort, no problems with respiration noted  Abdomen: Soft, gravid, appropriate for gestational age.  Pain/Pressure: Absent     Pelvic: Cervical exam deferred        Extremities: Normal range of motion.  Edema: None  Mental Status: Normal mood and affect. Normal behavior. Normal judgment and thought content.   Assessment and Plan:  Pregnancy: [id number] at [redacted]w[redacted]d 1. Supervision of high risk pregnancy, antepartum (Primary) Doing well. BP and FHR normal today.  2. [redacted] weeks gestation of pregnancy Anatomy u/s ordered, but not scheduled.  3. Antepartum multigravida of advanced maternal age  50. Type 2 diabetes mellitus without complication, without long-term current use of insulin  (HCC) See glucose log.  Fasting and PP values mostly out of range.  Patient  states reports not eating breakfast, but takes her blood sugars before lunch and 2 hours after lunch and dinner. Discussed the importance of eating 3 meals a day with small snacks in between meals.  Reviewed pt chart and details with Delon Emms, NP, and plan made to increase Basalgar insulin  to 20 units at bedtime and add a morning dose of 15 units.  Pt encouraged to eat breakfast with this regimen, and to eat a protein snack at 9 pm, and not eat after 9pm.  Virtual diabetes appt made with Delon Emms, NP, for diabetes management.  Pt to continue routine prenatal care with [facility].  Pt states understanding.  Spanish interpreter present for all communication.  5. Vaginal discharge - Cervicovaginal ancillary only( [place])     Preterm labor symptoms and general obstetric precautions including but not limited to vaginal bleeding, contractions, leaking of fluid and fetal movement were reviewed in detail with the patient. Please refer to After Visit Summary for other counseling recommendations.   Return in about 4 weeks (around 06/29/2024) for HROB.  Future Appointments  Date Time Provider Department Center  06/10/2024  9:15 AM [facility]    Derrek JINNY Freund, NP Student  Midwife Attestation:  I personally saw and evaluated the patient, performing the key elements of the service. I developed and verified the management plan that is described in the resident's/student's note, and I agree with the content with my edits above. VSS, HRR&R, Resp unlabored, Legs neg.    [name], CNM 11:55 AM

## 2024-06-02 LAB — CERVICOVAGINAL ANCILLARY ONLY
Bacterial Vaginitis (gardnerella): NEGATIVE
Candida Glabrata: POSITIVE — AB
Candida Vaginitis: NEGATIVE
Comment: NEGATIVE
Comment: NEGATIVE
Comment: NEGATIVE

## 2024-06-04 ENCOUNTER — Other Ambulatory Visit: Payer: Self-pay

## 2024-06-07 ENCOUNTER — Ambulatory Visit: Payer: Self-pay | Admitting: Advanced Practice Midwife

## 2024-06-07 MED ORDER — TERCONAZOLE 0.4 % VA CREA
1.0000 | TOPICAL_CREAM | Freq: Every day | VAGINAL | 0 refills | Status: DC
  Filled 2024-06-07: qty 45, 30d supply, fill #0

## 2024-06-08 ENCOUNTER — Other Ambulatory Visit: Payer: Self-pay

## 2024-06-09 ENCOUNTER — Other Ambulatory Visit: Payer: Self-pay

## 2024-06-10 ENCOUNTER — Other Ambulatory Visit: Payer: Self-pay

## 2024-06-10 DIAGNOSIS — E119 Type 2 diabetes mellitus without complications: Secondary | ICD-10-CM

## 2024-06-16 ENCOUNTER — Telehealth: Payer: Self-pay | Admitting: *Deleted

## 2024-06-16 NOTE — Telephone Encounter (Signed)
 Left patient a message with interpreter on the line to call and schedule.

## 2024-06-16 NOTE — Telephone Encounter (Signed)
-----   Message from [name] sent at 06/01/2024 11:47 AM EDT ----- Regarding: diabetes appt with Lindsay Hunt, Iva, I hope you are doing well.  I talked with Lindsay about this patient this morning and she would like to do a diabetes consult virtual visit with her. She is an adopt-a-mom patient at [facility], 19 weeks, with type 2 diabetes before pregnancy.  The patient is spanish speaking so if you can call her with an interpreter to tell her about the appointment that would be good, but she uses MyChart, so will be able to log on for the appointment.  Thank you!

## 2024-06-25 DIAGNOSIS — O99210 Obesity complicating pregnancy, unspecified trimester: Secondary | ICD-10-CM | POA: Insufficient documentation

## 2024-06-29 ENCOUNTER — Ambulatory Visit (INDEPENDENT_AMBULATORY_CARE_PROVIDER_SITE_OTHER): Payer: Self-pay | Admitting: Obstetrics and Gynecology

## 2024-06-29 ENCOUNTER — Other Ambulatory Visit: Payer: Self-pay

## 2024-06-29 VITALS — BP 99/63 | HR 86 | Wt 169.0 lb

## 2024-06-29 DIAGNOSIS — O24119 Pre-existing diabetes mellitus, type 2, in pregnancy, unspecified trimester: Secondary | ICD-10-CM

## 2024-06-29 DIAGNOSIS — O99210 Obesity complicating pregnancy, unspecified trimester: Secondary | ICD-10-CM

## 2024-06-29 DIAGNOSIS — O0990 Supervision of high risk pregnancy, unspecified, unspecified trimester: Secondary | ICD-10-CM

## 2024-06-29 DIAGNOSIS — O09529 Supervision of elderly multigravida, unspecified trimester: Secondary | ICD-10-CM

## 2024-06-29 DIAGNOSIS — E119 Type 2 diabetes mellitus without complications: Secondary | ICD-10-CM

## 2024-06-29 MED ORDER — "INSULIN SYRINGE 31G X 5/16"" 1 ML MISC"
4 refills | Status: DC
  Filled 2024-06-29: qty 100, 50d supply, fill #0
  Filled 2024-07-22 – 2024-08-02 (×3): qty 100, 50d supply, fill #1
  Filled 2024-08-24 – 2024-09-29 (×5): qty 100, 50d supply, fill #2
  Filled 2024-10-11: qty 100, 50d supply, fill #3

## 2024-06-29 MED ORDER — INSULIN REGULAR HUMAN 100 UNIT/ML IJ SOLN
INTRAMUSCULAR | 11 refills | Status: AC
Start: 2024-06-29 — End: ?
  Filled 2024-06-29: qty 10, 33d supply, fill #0
  Filled 2024-07-22 – 2024-07-23 (×2): qty 10, 33d supply, fill #1
  Filled 2024-08-24: qty 10, 33d supply, fill #2
  Filled 2024-09-22: qty 10, 33d supply, fill #3

## 2024-06-29 MED ORDER — BASAGLAR KWIKPEN 100 UNIT/ML ~~LOC~~ SOPN
PEN_INJECTOR | SUBCUTANEOUS | 1 refills | Status: DC
  Filled 2024-06-29: qty 15, 38d supply, fill #0
  Filled 2024-07-22: qty 15, 38d supply, fill #1

## 2024-06-29 NOTE — Progress Notes (Addendum)
 PRENATAL VISIT NOTE  Subjective:  Lindsay Hunt is a 38 y.o. [id number] at [redacted]w[redacted]d being seen today for ongoing prenatal care.  She is currently monitored for the following issues for this high-risk pregnancy and has Type 2 diabetes mellitus without complication, without long-term current use of insulin  (HCC); Supervision of high risk pregnancy, antepartum; Antepartum multigravida of advanced maternal age; and Obesity affecting pregnancy, antepartum on their problem list.  Patient reports no complaints.  Contractions: Not present. Vag. Bleeding: None.  Movement: (!) Decreased. Denies leaking of fluid.  States she has never really felt the baby move.  Adopt a mom interpreter used for today's visit.  The following portions of the patient's history were reviewed and updated as appropriate: allergies, current medications, past family history, past medical history, past social history, past surgical history and problem list.   Objective:   Vitals:   06/29/24 1420  BP: 99/63  Pulse: 86  Weight: 169 lb (76.7 kg)   Body mass index is 33.01 kg/m. Total weight gain: 4 lb (1.814 kg)   Fetal Status: Fetal Heart Rate (bpm): 154 Fundal Height: 23 cm Movement: (!) Decreased     General:  Alert, oriented and cooperative. Patient is in no acute distress.  Skin: Skin is warm and dry. No rash noted.   Cardiovascular: Normal heart rate noted  Respiratory: Normal respiratory effort, no problems with respiration noted  Abdomen: Soft, gravid, appropriate for gestational age.  Pain/Pressure: Absent     Pelvic: Cervical exam deferred        Extremities: Normal range of motion.  Edema: None  Mental Status: Normal mood and affect. Normal behavior. Normal judgment and thought content.     Assessment and Plan:  Pregnancy: G5P3013 at [redacted]w[redacted]d 1. Supervision of high risk pregnancy, antepartum (Primary) Anticipatory guidance, reviewed that she should start to feel movements soon, possible anterior  placenta? Normal fetal heart tones today.  2. Type 2 diabetes mellitus during pregnancy, antepartum Uncontrolled. Globally elevated. Taking insulin  as prescribed Lantus  15 qam and 20 qpm. Needs mealtime insulin . Increase lantus  to 15 qam, 25 qpm, add regular insulin  17 units with breakfast, 13 units with dinner as per weight based insulin  calculations. Follow up in 1 week to re-assess. A1c today as well as optho and fetal echo referrals - Hemoglobin A1c - US  Fetal Echocardiography; Future - Ambulatory referral to Ophthalmology  3. Antepartum multigravida of advanced maternal age   62. Obesity affecting pregnancy, antepartum, unspecified obesity type   5. Type 2 diabetes mellitus without complication, without long-term current use of insulin  (HCC)  - Insulin  Glargine (BASAGLAR  KWIKPEN) 100 UNIT/ML; Inject 15 Units into the skin in the morning AND 25 Units every evening.  Dispense: 15 mL; Refill: 1 - insulin  regular (NOVOLIN R) 100 units/mL injection; Inject 0.17 mLs (17 Units total) into the skin daily with breakfast AND 0.13 mLs (13 Units total) daily with supper.  Dispense: 10 mL; Refill: 11 - Insulin  Syringe-Needle U-100 (INSULIN  SYRINGE 1CC/31GX5/16) 31G X 5/16 1 ML MISC; 1 Box by Does not apply route as needed.  Dispense: 1 each; Refill: 4   Preterm labor symptoms and general obstetric precautions including but not limited to vaginal bleeding, contractions, leaking of fluid and fetal movement were reviewed in detail with the patient. Please refer to After Visit Summary for other counseling recommendations.   Return in about 1 week (around 07/06/2024) for glucose log review.  Future Appointments  Date Time Provider Department Center  07/06/2024 10:00 AM [facility] PROVIDER  1 [facility]  07/06/2024 10:30 AM [facility] US[facility]    Rollo ONEIDA Bring, MD

## 2024-06-29 NOTE — Progress Notes (Signed)
 HROB, c/o not feeling lots of movement, less than 10/day.

## 2024-06-30 ENCOUNTER — Ambulatory Visit: Payer: Self-pay | Admitting: Obstetrics and Gynecology

## 2024-06-30 ENCOUNTER — Other Ambulatory Visit: Payer: Self-pay

## 2024-06-30 DIAGNOSIS — E119 Type 2 diabetes mellitus without complications: Secondary | ICD-10-CM

## 2024-06-30 LAB — HEMOGLOBIN A1C
Est. average glucose Bld gHb Est-mCnc: 143 mg/dL
Hgb A1c MFr Bld: 6.6 % — ABNORMAL HIGH (ref 4.8–5.6)

## 2024-07-06 ENCOUNTER — Other Ambulatory Visit: Payer: Self-pay | Admitting: *Deleted

## 2024-07-06 ENCOUNTER — Ambulatory Visit (HOSPITAL_BASED_OUTPATIENT_CLINIC_OR_DEPARTMENT_OTHER): Payer: Self-pay

## 2024-07-06 ENCOUNTER — Ambulatory Visit: Payer: Self-pay | Attending: Obstetrics and Gynecology | Admitting: Obstetrics

## 2024-07-06 VITALS — BP 123/65 | HR 80

## 2024-07-06 DIAGNOSIS — O0990 Supervision of high risk pregnancy, unspecified, unspecified trimester: Secondary | ICD-10-CM

## 2024-07-06 DIAGNOSIS — Z7982 Long term (current) use of aspirin: Secondary | ICD-10-CM | POA: Insufficient documentation

## 2024-07-06 DIAGNOSIS — O24112 Pre-existing diabetes mellitus, type 2, in pregnancy, second trimester: Secondary | ICD-10-CM | POA: Insufficient documentation

## 2024-07-06 DIAGNOSIS — O09523 Supervision of elderly multigravida, third trimester: Secondary | ICD-10-CM

## 2024-07-06 DIAGNOSIS — Z3A24 24 weeks gestation of pregnancy: Secondary | ICD-10-CM

## 2024-07-06 DIAGNOSIS — O09522 Supervision of elderly multigravida, second trimester: Secondary | ICD-10-CM | POA: Insufficient documentation

## 2024-07-06 DIAGNOSIS — E119 Type 2 diabetes mellitus without complications: Secondary | ICD-10-CM

## 2024-07-06 DIAGNOSIS — O99212 Obesity complicating pregnancy, second trimester: Secondary | ICD-10-CM

## 2024-07-06 DIAGNOSIS — E669 Obesity, unspecified: Secondary | ICD-10-CM

## 2024-07-06 DIAGNOSIS — Z794 Long term (current) use of insulin: Secondary | ICD-10-CM

## 2024-07-06 DIAGNOSIS — Z363 Encounter for antenatal screening for malformations: Secondary | ICD-10-CM | POA: Insufficient documentation

## 2024-07-06 DIAGNOSIS — O24113 Pre-existing diabetes mellitus, type 2, in pregnancy, third trimester: Secondary | ICD-10-CM | POA: Insufficient documentation

## 2024-07-06 DIAGNOSIS — Z364 Encounter for antenatal screening for fetal growth retardation: Secondary | ICD-10-CM | POA: Insufficient documentation

## 2024-07-06 DIAGNOSIS — O24119 Pre-existing diabetes mellitus, type 2, in pregnancy, unspecified trimester: Secondary | ICD-10-CM

## 2024-07-06 DIAGNOSIS — O24319 Unspecified pre-existing diabetes mellitus in pregnancy, unspecified trimester: Secondary | ICD-10-CM

## 2024-07-06 DIAGNOSIS — O99210 Obesity complicating pregnancy, unspecified trimester: Secondary | ICD-10-CM

## 2024-07-06 NOTE — Progress Notes (Signed)
 MFM Consult Note  Lindsay Hunt is currently at 24 weeks and 1 day.  She was seen due to advanced maternal age (38 years old) and pregestational diabetes treated with insulin .    Her most recent hemoglobin A1c was 6.6%.  She reports that her fingerstick values remain elevated.  She had a cell free DNA test earlier in her pregnancy which indicated a low risk for trisomy 36, 38, and 13. A female fetus is predicted.   Sonographic findings Single intrauterine pregnancy at 24w 1d  Fetal cardiac activity:  Observed and appears normal. Presentation: Cephalic. The anatomic structures that were well seen appear normal without evidence of soft markers. Due to poor acoustic windows some structures remain suboptimally visualized. Fetal biometry shows the estimated fetal weight of 1 pound 8 ounces which measures at the 53rd percentile.  Amniotic fluid: Within normal limits.  MVP: 5.45 cm. Placenta: Anterior. Adnexa: No abnormality visualized. Cervical length: 3.5 cm.  The views of the fetal anatomy were limited today due to the fetal position.  The patient was informed that anomalies may be missed due to technical limitations. If the fetus is in a suboptimal position or maternal habitus is increased, visualization of the fetus in the maternal uterus may be impaired.  Pregestational diabetes The patient was advised to continue using insulin  as prescribed for control of her diabetes. She was advised that our goals for her fingerstick values are fasting values of 90-95 or less and two-hour postprandial values of 120 or less.   Her insulin  dose may need to be adjusted to help her achieve better glycemic control. Due to diabetes, we will continue to follow her with monthly growth ultrasounds.   Weekly fetal testing should be started at 32 weeks.  Due to pregestational diabetes, she was referred to [facility] pediatric cardiology for fetal echocardiogram. Due to her age and pregestational diabetes,  delivery should be considered at between 37 to 38 weeks.  Advanced maternal age The increased risk of fetal aneuploidy due to advanced maternal age was discussed.  Due to advanced maternal age, the patient was offered and declined an amniocentesis today for definitive diagnosis of fetal aneuploidy.  She is comfortable with the low risk indicated by her cell free DNA test. She was advised to continue taking a daily baby aspirin  for preeclampsia prophylaxis.  A follow-up exam was scheduled in 4 weeks to complete the views of the fetal anatomy and to assess the fetal growth.  The patient stated that all of her questions were answered today.    All conversations were held with the patient today with the help of a Spanish interpreter.  A total of 30 minutes was spent counseling and coordinating the care for this patient.  Greater than 50% of the time was spent in direct face-to-face contact.

## 2024-07-09 ENCOUNTER — Ambulatory Visit (INDEPENDENT_AMBULATORY_CARE_PROVIDER_SITE_OTHER): Payer: Self-pay | Admitting: Obstetrics and Gynecology

## 2024-07-09 VITALS — BP 116/73 | HR 79 | Wt 168.0 lb

## 2024-07-09 DIAGNOSIS — O0990 Supervision of high risk pregnancy, unspecified, unspecified trimester: Secondary | ICD-10-CM

## 2024-07-09 DIAGNOSIS — O09529 Supervision of elderly multigravida, unspecified trimester: Secondary | ICD-10-CM

## 2024-07-09 DIAGNOSIS — Z3A24 24 weeks gestation of pregnancy: Secondary | ICD-10-CM

## 2024-07-09 DIAGNOSIS — O99210 Obesity complicating pregnancy, unspecified trimester: Secondary | ICD-10-CM

## 2024-07-09 DIAGNOSIS — O24119 Pre-existing diabetes mellitus, type 2, in pregnancy, unspecified trimester: Secondary | ICD-10-CM | POA: Insufficient documentation

## 2024-07-09 NOTE — Progress Notes (Signed)
 Pt has fetal echo ordered but has not yet been scheduled.  Pt has glucose log today for review - she had some insulin  changes last visit.

## 2024-07-09 NOTE — Progress Notes (Signed)
   PRENATAL VISIT NOTE  Subjective:  Lindsay Hunt is a 38 y.o. H4E6986 at [redacted]w[redacted]d being seen today for ongoing prenatal care.  She is currently monitored for the following issues for this high-risk pregnancy and has Type 2 diabetes mellitus without complication, without long-term current use of insulin  (HCC); Supervision of high risk pregnancy, antepartum; Antepartum multigravida of advanced maternal age; Obesity affecting pregnancy, antepartum; and Type 2 diabetes mellitus complicating pregnancy, antepartum on their problem list.  Patient doing well with no acute concerns today. She reports no complaints.  Contractions: Not present. Vag. Bleeding: None.  Movement: Present. Denies leaking of fluid.   The following portions of the patient's history were reviewed and updated as appropriate: allergies, current medications, past family history, past medical history, past social history, past surgical history and problem list. Problem list updated.  Objective:   Vitals:   07/09/24 1014  BP: 116/73  Pulse: 79  Weight: 168 lb (76.2 kg)    Fetal Status: Fetal Heart Rate (bpm): 155 Fundal Height: 24 cm Movement: Present     General:  Alert, oriented and cooperative. Patient is in no acute distress.  Skin: Skin is warm and dry. No rash noted.   Cardiovascular: Normal heart rate noted  Respiratory: Normal respiratory effort, no problems with respiration noted  Abdomen: Soft, gravid, appropriate for gestational age.  Pain/Pressure: Absent     Pelvic: Cervical exam deferred        Extremities: Normal range of motion.     Mental Status:  Normal mood and affect. Normal behavior. Normal judgment and thought content.   Assessment and Plan:  Pregnancy: G5P3013 at [redacted]w[redacted]d  1. [redacted] weeks gestation of pregnancy (Primary)   2. Supervision of high risk pregnancy, antepartum Continue routine prenatal care  3. Obesity affecting pregnancy, antepartum, unspecified obesity type   4. Antepartum  multigravida of advanced maternal age   60. Type 2 diabetes mellitus complicating pregnancy, antepartum FBS: 90-109 PPBS: 98-154  Will increase lantus  from 15 units in AM to 17 units and evening from 25 units to 27 units  AM regular insulin  remains the same at 17  units and increase evening regular insulin  from 13 units to 15 units.  A1c was 6.6 Pt still needs to follow up for fetal echo and optho exam   Preterm labor symptoms and general obstetric precautions including but not limited to vaginal bleeding, contractions, leaking of fluid and fetal movement were reviewed in detail with the patient.  Please refer to After Visit Summary for other counseling recommendations.   Return in about 2 weeks (around 07/23/2024) for [facility], in person.   Jerilynn Buddle, MD Faculty Attending Center for [facility]

## 2024-07-17 ENCOUNTER — Encounter: Payer: Self-pay | Admitting: Obstetrics and Gynecology

## 2024-07-22 ENCOUNTER — Other Ambulatory Visit: Payer: Self-pay

## 2024-07-23 ENCOUNTER — Encounter: Payer: Self-pay | Admitting: Obstetrics

## 2024-07-23 ENCOUNTER — Ambulatory Visit: Payer: Self-pay | Admitting: Obstetrics

## 2024-07-23 ENCOUNTER — Other Ambulatory Visit: Payer: Self-pay

## 2024-07-23 VITALS — BP 104/67 | HR 77 | Wt 168.5 lb

## 2024-07-23 DIAGNOSIS — E119 Type 2 diabetes mellitus without complications: Secondary | ICD-10-CM

## 2024-07-23 DIAGNOSIS — O09529 Supervision of elderly multigravida, unspecified trimester: Secondary | ICD-10-CM

## 2024-07-23 DIAGNOSIS — O99210 Obesity complicating pregnancy, unspecified trimester: Secondary | ICD-10-CM

## 2024-07-23 DIAGNOSIS — O0990 Supervision of high risk pregnancy, unspecified, unspecified trimester: Secondary | ICD-10-CM

## 2024-07-23 MED ORDER — BASAGLAR KWIKPEN 100 UNIT/ML ~~LOC~~ SOPN
PEN_INJECTOR | SUBCUTANEOUS | 11 refills | Status: DC
  Filled 2024-07-23: qty 15, 38d supply, fill #0
  Filled 2024-08-02: qty 12, 30d supply, fill #0
  Filled 2024-08-24 – 2024-08-27 (×4): qty 12, 30d supply, fill #1
  Filled 2024-09-22: qty 12, 30d supply, fill #2

## 2024-07-23 NOTE — Progress Notes (Addendum)
 Subjective:  Lindsay Hunt is a 38 y.o. [phone] at [redacted]w[redacted]d being seen today for ongoing prenatal care.  She is currently monitored for the following issues for this high-risk pregnancy and has Type 2 diabetes mellitus without complication, without long-term current use of insulin  (HCC); Supervision of high risk pregnancy, antepartum; Antepartum multigravida of advanced maternal age; Obesity affecting pregnancy, antepartum; and Type 2 diabetes mellitus complicating pregnancy, antepartum on their problem list.  Patient reports heartburn.  Contractions: Not present.  .  Movement: Present. Denies leaking of fluid.   The following portions of the patient's history were reviewed and updated as appropriate: allergies, current medications, past family history, past medical history, past social history, past surgical history and problem list. Problem list updated.  Objective:   Vitals:   07/23/24 1000  BP: 104/67  Pulse: 77  Weight: 168 lb 8 oz (76.4 kg)    Fetal Status:     Movement: Present     General:  Alert, oriented and cooperative. Patient is in no acute distress.  Skin: Skin is warm and dry. No rash noted.   Cardiovascular: Normal heart rate noted  Respiratory: Normal respiratory effort, no problems with respiration noted  Abdomen: Soft, gravid, appropriate for gestational age. Pain/Pressure: Present (back)     Pelvic:  Cervical exam deferred        Extremities: Normal range of motion.  Edema: None  Mental Status: Normal mood and affect. Normal behavior. Normal judgment and thought content.   Urinalysis:      Assessment and Plan:  Pregnancy: [id number] at [redacted]w[redacted]d  1. Supervision of high risk pregnancy, antepartum (Primary)  2. Antepartum multigravida of advanced maternal age  110. Type 2 diabetes mellitus without complication, without long-term current use of insulin  (HCC) - Good glucose control:  FBS < 100   and   2 Hour PP < 120 Rx: - Insulin  Glargine (BASAGLAR  KWIKPEN) 100  UNIT/ML; Inject 15 Units into the skin in the morning AND 25 Units every evening.  Dispense: 15 mL; Refill: 11  4. Obesity affecting pregnancy, antepartum, unspecified obesity type    Preterm labor symptoms and general obstetric precautions including but not limited to vaginal bleeding, contractions, leaking of fluid and fetal movement were reviewed in detail with the patient. Please refer to After Visit Summary for other counseling recommendations.   Return in about 2 weeks (around 08/06/2024) for [facility].   Rudy Carlin LABOR, MD 07/23/2024

## 2024-07-23 NOTE — Progress Notes (Signed)
 Pt presents for Hob. Pt has no questions or concerns at this time.

## 2024-07-28 ENCOUNTER — Other Ambulatory Visit: Payer: Self-pay

## 2024-08-02 ENCOUNTER — Other Ambulatory Visit: Payer: Self-pay

## 2024-08-03 ENCOUNTER — Other Ambulatory Visit: Payer: Self-pay

## 2024-08-03 NOTE — Progress Notes (Signed)
 Due to language barrier, Interpreter from [employer] utilized. Interpreter roberto # [id number] . Language used by interpreter was [place].

## 2024-08-03 NOTE — Progress Notes (Signed)
 [facility]  [address] [place] [address] Ph: [phone]  Fax: [phone] Appt: [phone]      Date   08/03/2024  Patient information   Lindsay Hunt [address] [place] [address] T:   E: [email or web address] Date of birth: [birth year]  Age: 38 y.o.  Requesting provider   Ileana Babara Rushie Steffan, MD [address]  [facility] [place],  [place] [address] T: [phone]  F: [phone]  Chief complaint   Chief Complaint  Patient presents with  . DM FETAL ECHO     History of present illness  I had the pleasure of seeing Lindsay Hunt at the [employer] office for fetal cardiac consultation and fetal echocardiography at the request of Ileana Babara Rushie Steffan, MD. Records were reviewed, and a summary of those records is integrated within the history of present illness. History is obtained from chart review and patient.  A [facility] [employer] Patient [facility] approved interpreter service, was used to obtain history and to provide plan of care to the patient and the family. Lindsay Hunt is a 38 y.o. year old G36P3013 woman currently [redacted]w[redacted]d with a single female fetus. Estimated Date of Delivery: 10/25/24.  The indication for today's visit includes type 2 DM. She reports good glucose control. Her most recent hemoglobin A1c on 06/29/24 was 6.6.  She had low risk NIPS.  She denies any other complications with this pregnancy. She has felt good fetal movement. She plans to deliver at [facility]. The available medical record was reviewed in detail and is in agreement with the HPI and past medical history.  Past medical/surgical history   Past Medical History:  Diagnosis Date  . Diabetes mellitus without complication (CMS/HHS-HCC)    History reviewed. No pertinent surgical history.   Medications  Lindsay Hunt has a current  medication list which includes the following prescription(s): aspirin , basaglar  kwikpen u-100 insulin , insulin  regular, pen needle, diabetic, prenatal vit-iron fum-folic ac, and syringe-needle,insulin ,0.5 ml.   Allergies  No Known Allergies  Family history  There is no known family history of congenital heart disease, arrhythmias, sudden cardiac death, or other birth defects.  Social History   Social History   Tobacco Use  Smoking Status Never  Smokeless Tobacco Never    Review of Systems  A review of systems was negative except as noted in the HPI or below.   Physical Exam  BP 109/71   Pulse 86   Resp 20   Ht 155 cm (5' 1.02)   Wt 76.9 kg (169 lb 8.5 oz)   LMP 01/24/2024   SpO2 98%   BMI 32.01 kg/m   Patient is well appearing and in no distress.  She has normal work of breathing. Abdomen is significant for a gravida uterus but is otherwise soft and non-tender. Extremities - no swelling or edema noted. Neuro - grossly intact without focal deficits.  Fetal Echocardiogram  A complete study was performed today, which was technically good.  Please see separate echocardiogram report for full details. There is normal fetal cardiac anatomy and function.  No major heart disease was identified.   Diagnosis     ICD-10-CM   1. Pre-existing type 2 diabetes mellitus during pregnancy in second trimester (HHS-HCC)  O24.112 PEDS ECHO Fetal Echo    2. Need for vaccination  Z23         Impressions  I  am happy to report that  Lindsay Hunt's fetal heart appears normal.  I have discussed with the patient that glycemic control during pregnancy is important to reduce fetal malformations in the first trimester and to prevent macrosomia and hypertrophic cardiomyopathy during the 3rd trimester.   These results were discussed in detail, and all questions were answered.  There is no fetal cardiac indication to alter her prenatal care or delivery plan.  The fetus does not require any  further cardiac testing.    The limitations to fetal echocardiography include the presence of small to moderate atrial and ventricular septal defects, mild valve abnormalities, persistence of the ductus arteriosus after birth, postnatal development of coarctation of the aorta, and other cardiac and vascular anomalies too subtle to be imaged prenatally. These limitations were discussed with the patient.  Although no scheduled postnatal cardiac testing is indicated at this time, the baby should have appropriate cardiac evaluation if there are any clinical concerns after delivery.     Recommendation(s)  No further prenatal cardiac follow-up is required unless any new cardiac concerns are noted.  No fetal cardiac indication to alter newborn care. No postnatal cardiology consultation or echocardiogram is indicated at this time. If there are concerns for heart disease after birth, then pediatric cardiology should be consulted.   It was my pleasure to meet and evaluate [name] today. If there are any questions or concerns regarding this evaluation, please do not hesitate to contact me.    I was personally with the patient for 30 minutes. More than 50% of this time was spent doing counseling.  Sincerely,  Darcey Harlem, MD, MPH  [facility]ediatric [facility] Phone: [phone], Fax: [phone] On call: [phone] or [phone] [email or web address]  I personally performed the service. (TP)  MCALLISTER CLAUD HARLEM, MD

## 2024-08-04 ENCOUNTER — Ambulatory Visit: Payer: Self-pay | Attending: Obstetrics and Gynecology | Admitting: Maternal & Fetal Medicine

## 2024-08-04 ENCOUNTER — Ambulatory Visit (INDEPENDENT_AMBULATORY_CARE_PROVIDER_SITE_OTHER): Payer: Self-pay | Admitting: Obstetrics and Gynecology

## 2024-08-04 ENCOUNTER — Other Ambulatory Visit: Payer: Self-pay | Admitting: *Deleted

## 2024-08-04 ENCOUNTER — Encounter: Payer: Self-pay | Admitting: Obstetrics and Gynecology

## 2024-08-04 ENCOUNTER — Other Ambulatory Visit: Payer: Self-pay

## 2024-08-04 ENCOUNTER — Ambulatory Visit: Payer: Self-pay

## 2024-08-04 VITALS — BP 128/65 | HR 82

## 2024-08-04 VITALS — BP 108/70 | HR 96 | Wt 172.6 lb

## 2024-08-04 DIAGNOSIS — O24119 Pre-existing diabetes mellitus, type 2, in pregnancy, unspecified trimester: Secondary | ICD-10-CM

## 2024-08-04 DIAGNOSIS — O09523 Supervision of elderly multigravida, third trimester: Secondary | ICD-10-CM | POA: Insufficient documentation

## 2024-08-04 DIAGNOSIS — O24113 Pre-existing diabetes mellitus, type 2, in pregnancy, third trimester: Secondary | ICD-10-CM

## 2024-08-04 DIAGNOSIS — K649 Unspecified hemorrhoids: Secondary | ICD-10-CM

## 2024-08-04 DIAGNOSIS — Z3A28 28 weeks gestation of pregnancy: Secondary | ICD-10-CM | POA: Insufficient documentation

## 2024-08-04 DIAGNOSIS — O99213 Obesity complicating pregnancy, third trimester: Secondary | ICD-10-CM | POA: Insufficient documentation

## 2024-08-04 DIAGNOSIS — E119 Type 2 diabetes mellitus without complications: Secondary | ICD-10-CM

## 2024-08-04 DIAGNOSIS — E669 Obesity, unspecified: Secondary | ICD-10-CM

## 2024-08-04 DIAGNOSIS — O09529 Supervision of elderly multigravida, unspecified trimester: Secondary | ICD-10-CM

## 2024-08-04 DIAGNOSIS — O99210 Obesity complicating pregnancy, unspecified trimester: Secondary | ICD-10-CM

## 2024-08-04 DIAGNOSIS — O0990 Supervision of high risk pregnancy, unspecified, unspecified trimester: Secondary | ICD-10-CM

## 2024-08-04 DIAGNOSIS — Z363 Encounter for antenatal screening for malformations: Secondary | ICD-10-CM | POA: Insufficient documentation

## 2024-08-04 MED ORDER — HYDROCORTISONE (PERIANAL) 2.5 % EX CREA
TOPICAL_CREAM | Freq: Two times a day (BID) | CUTANEOUS | 2 refills | Status: DC
  Filled 2024-08-04: qty 30, 15d supply, fill #0

## 2024-08-04 NOTE — Progress Notes (Signed)
   PRENATAL VISIT NOTE  Subjective:  Lindsay Hunt is a 38 y.o. [phone] at [redacted]w[redacted]d being seen today for ongoing prenatal care.  She is currently monitored for the following issues for this high-risk pregnancy and has Type 2 diabetes mellitus without complication, without long-term current use of insulin  (HCC); Supervision of high risk pregnancy, antepartum; Antepartum multigravida of advanced maternal age; Obesity affecting pregnancy, antepartum; and Type 2 diabetes mellitus complicating pregnancy, antepartum on their problem list.  Patient reports Hemorrhoid and constipation.  Contractions: Not present. Vag. Bleeding: None.  Movement: Present. Denies leaking of fluid.   The following portions of the patient's history were reviewed and updated as appropriate: allergies, current medications, past family history, past medical history, past social history, past surgical history and problem list.   Objective:    Vitals:   08/04/24 1443  BP: 108/70  Pulse: 96  Weight: 78.3 kg    Fetal Status:  Fetal Heart Rate (bpm): 142   Movement: Present    General: Alert, oriented and cooperative. Patient is in no acute distress.  Skin: Skin is warm and dry. No rash noted.   Cardiovascular: Normal heart rate noted  Respiratory: Normal respiratory effort, no problems with respiration noted  Abdomen: Soft, gravid, appropriate for gestational age.  Pain/Pressure: Absent     Pelvic: Cervical exam deferred        Extremities: Normal range of motion.  Edema: None  Mental Status: Normal mood and affect. Normal behavior. Normal judgment and thought content.   Assessment and Plan:  Pregnancy: G5P3013 at [redacted]w[redacted]d 1. [redacted] weeks gestation of pregnancy (Primary) - CBC - HIV antibody (with reflex) - RPR  2. Type 2 diabetes mellitus without complication, without long-term current use of insulin  (HCC)  No log brought to clinic for blood sugars- provided a sheet in office today.  She reports her fasting AM  blood glucose 80s-90s; After meals 120-125 ; at bedtime 125 most often. Reports taking Novolin and Glargine as prescribed. Discussed increasing meds. After discussion she reports taking her Novolin after meals. Called with interpreter to keep taking Novolin at current dose but to take it 20 min prior to breakfast and dinner. Continue Glargine as is, my chart message sent as well.   Had normal fetal echo Had follow up u/s today  3. Hemorrhoids, unspecified hemorrhoid type Encouraged to increase fluid intake and try taking Mirilax for constipation relief. Prescribed Anusol.    Preterm labor symptoms and general obstetric precautions including but not limited to vaginal bleeding, contractions, leaking of fluid and fetal movement were reviewed in detail with the patient. Please refer to After Visit Summary for other counseling recommendations.   Follow up in clinic in 2 weeks, return to clinic sooner should concerns arise.  Future Appointments  Date Time Provider Department Center  08/18/2024  1:50 PM Rudy Carlin LABOR, MD [facility] None  09/01/2024  3:15 PM [facility] PROVIDER 1 [facility]  09/01/2024  3:30 PM [facility] US3 [facility]  09/29/2024 11:15 AM [facility] PROVIDER 1 [facility]  09/29/2024 11:30 AM [facility] US1 [facility]    Verley Pariseau, FNP

## 2024-08-04 NOTE — Progress Notes (Signed)
 After review, MFM consult with provider is not indicated for today  William Glenn, DO 08/04/2024 12:55 PM  [facility]

## 2024-08-04 NOTE — Progress Notes (Signed)
 T2DM; pt did not bring her log of readings.  Pt concerned she may have hemorrhoids. Pain, itching, a ball is present.   No other concerns at this time.

## 2024-08-05 ENCOUNTER — Ambulatory Visit: Payer: Self-pay | Admitting: Obstetrics and Gynecology

## 2024-08-05 LAB — CBC
Hematocrit: 35.6 % (ref 34.0–46.6)
Hemoglobin: 11.8 g/dL (ref 11.1–15.9)
MCH: 31.6 pg (ref 26.6–33.0)
MCHC: 33.1 g/dL (ref 31.5–35.7)
MCV: 95 fL (ref 79–97)
Platelets: 246 10*3/uL (ref 150–450)
RBC: 3.74 x10E6/uL — ABNORMAL LOW (ref 3.77–5.28)
RDW: 12 % (ref 11.7–15.4)
WBC: 6.7 10*3/uL (ref 3.4–10.8)

## 2024-08-05 LAB — HIV ANTIBODY (ROUTINE TESTING W REFLEX): HIV Screen 4th Generation wRfx: NONREACTIVE

## 2024-08-05 LAB — SYPHILIS: RPR W/REFLEX TO RPR TITER AND TREPONEMAL ANTIBODIES, TRADITIONAL SCREENING AND DIAGNOSIS ALGORITHM: RPR Ser Ql: NONREACTIVE

## 2024-08-09 ENCOUNTER — Telehealth: Payer: Self-pay | Admitting: *Deleted

## 2024-08-09 NOTE — Telephone Encounter (Signed)
 Will give patient a message to check voicemail and call office when phone lines are open.

## 2024-08-09 NOTE — Telephone Encounter (Signed)
 Left patient a message with interpreter on the line to call and schedule.

## 2024-08-13 ENCOUNTER — Other Ambulatory Visit: Payer: Self-pay

## 2024-08-18 ENCOUNTER — Encounter: Payer: Self-pay | Admitting: Obstetrics

## 2024-08-18 ENCOUNTER — Ambulatory Visit (INDEPENDENT_AMBULATORY_CARE_PROVIDER_SITE_OTHER): Payer: Self-pay | Admitting: Obstetrics

## 2024-08-18 VITALS — Wt 172.2 lb

## 2024-08-18 DIAGNOSIS — O99210 Obesity complicating pregnancy, unspecified trimester: Secondary | ICD-10-CM

## 2024-08-18 DIAGNOSIS — E119 Type 2 diabetes mellitus without complications: Secondary | ICD-10-CM

## 2024-08-18 DIAGNOSIS — O09529 Supervision of elderly multigravida, unspecified trimester: Secondary | ICD-10-CM

## 2024-08-18 DIAGNOSIS — O0990 Supervision of high risk pregnancy, unspecified, unspecified trimester: Secondary | ICD-10-CM

## 2024-08-18 NOTE — Progress Notes (Signed)
 Subjective:  Lindsay Hunt is a 38 y.o. [phone] at [redacted]w[redacted]d being seen today for ongoing prenatal care.  She is currently monitored for the following issues for this high-risk pregnancy and has Type 2 diabetes mellitus without complication, without long-term current use of insulin  (HCC); Supervision of high risk pregnancy, antepartum; Antepartum multigravida of advanced maternal age; Obesity affecting pregnancy, antepartum; and Type 2 diabetes mellitus complicating pregnancy, antepartum on their problem list.  Patient reports dizziness.  Contractions: Not present. Vag. Bleeding: None.  Movement: Present. Denies leaking of fluid.   The following portions of the patient's history were reviewed and updated as appropriate: allergies, current medications, past family history, past medical history, past social history, past surgical history and problem list. Problem list updated.  Objective:   Vitals:   08/18/24 1346  Weight: 172 lb 3.2 oz (78.1 kg)    Fetal Status: Fetal Heart Rate (bpm): 148   Movement: Present     General:  Alert, oriented and cooperative. Patient is in no acute distress.  Skin: Skin is warm and dry. No rash noted.   Cardiovascular: Normal heart rate noted  Respiratory: Normal respiratory effort, no problems with respiration noted  Abdomen: Soft, gravid, appropriate for gestational age. Pain/Pressure: Absent     Pelvic:  Cervical exam deferred        Extremities: Normal range of motion.  Edema: None  Mental Status: Normal mood and affect. Normal behavior. Normal judgment and thought content.   Urinalysis:      Assessment and Plan:  Pregnancy: [id number] at [redacted]w[redacted]d  1. Supervision of high risk pregnancy, antepartum (Primary)  2. Antepartum multigravida of advanced maternal age  18. Type 2 diabetes mellitus without complication, without long-term current use of insulin  (HCC) - good glucose control:  FBS = 60-90's   and   2 hour PP < 120  4. Obesity affecting pregnancy,  antepartum, unspecified obesity type   Preterm labor symptoms and general obstetric precautions including but not limited to vaginal bleeding, contractions, leaking of fluid and fetal movement were reviewed in detail with the patient. Please refer to After Visit Summary for other counseling recommendations.   Return in about 1 week (around 08/25/2024) for [facility].   Rudy Carlin LABOR, MD 08/18/2024

## 2024-08-18 NOTE — Progress Notes (Signed)
 Dizziness and very tired/fatigued for about one week.  Denies HA, vision changes, numbness or tingling in extremities.  Denies any further concerns

## 2024-08-24 ENCOUNTER — Other Ambulatory Visit: Payer: Self-pay

## 2024-08-25 ENCOUNTER — Other Ambulatory Visit (HOSPITAL_COMMUNITY)
Admission: RE | Admit: 2024-08-25 | Discharge: 2024-08-25 | Disposition: A | Discharge status: Home / Self Care | Payer: Self-pay | Source: Ambulatory Visit | Attending: Obstetrics & Gynecology | Admitting: Obstetrics & Gynecology

## 2024-08-25 ENCOUNTER — Other Ambulatory Visit: Payer: Self-pay

## 2024-08-25 ENCOUNTER — Ambulatory Visit: Payer: Self-pay | Admitting: Obstetrics & Gynecology

## 2024-08-25 VITALS — BP 110/73 | HR 83 | Wt 172.0 lb

## 2024-08-25 DIAGNOSIS — O0990 Supervision of high risk pregnancy, unspecified, unspecified trimester: Secondary | ICD-10-CM

## 2024-08-25 DIAGNOSIS — O99213 Obesity complicating pregnancy, third trimester: Secondary | ICD-10-CM

## 2024-08-25 DIAGNOSIS — Z3A31 31 weeks gestation of pregnancy: Secondary | ICD-10-CM

## 2024-08-25 DIAGNOSIS — O0993 Supervision of high risk pregnancy, unspecified, third trimester: Secondary | ICD-10-CM

## 2024-08-25 DIAGNOSIS — O99210 Obesity complicating pregnancy, unspecified trimester: Secondary | ICD-10-CM

## 2024-08-25 DIAGNOSIS — E119 Type 2 diabetes mellitus without complications: Secondary | ICD-10-CM

## 2024-08-25 LAB — POCT URINALYSIS DIPSTICK
Bilirubin, UA: NEGATIVE
Blood, UA: NEGATIVE
Glucose, UA: POSITIVE — AB
Ketones, UA: NEGATIVE
Nitrite, UA: NEGATIVE
Protein, UA: NEGATIVE
Spec Grav, UA: 1.015 (ref 1.010–1.025)
Urobilinogen, UA: 0.2 U/dL
pH, UA: 6.5 (ref 5.0–8.0)

## 2024-08-25 MED ORDER — TRUEPLUS 5-BEVEL PEN NEEDLES 31G X 8 MM MISC
1.0000 | Freq: Every day | 3 refills | Status: DC
  Filled 2024-08-26 – 2024-08-27 (×2): qty 100, 100d supply, fill #0
  Filled 2024-08-28: qty 100, 50d supply, fill #0
  Filled 2024-09-22 – 2024-09-29 (×2): qty 100, 50d supply, fill #1
  Filled 2024-10-11: qty 100, 50d supply, fill #2

## 2024-08-25 NOTE — Progress Notes (Signed)
 Pt complains of ctx with activity, also having increase in thirst and strong urine - denies any dysuria.  Pt is checking glucose at home but did not bring readings.  Pt states the readings have been within range.   Pt needs refills on insulin  pen needles, will order today.

## 2024-08-25 NOTE — Progress Notes (Signed)
 PRENATAL VISIT NOTE  Subjective:  Lindsay Hunt is a 38 y.o. [phone] at [redacted]w[redacted]d being seen today for ongoing prenatal care.  She is currently monitored for the following issues for this high-risk pregnancy and has Type 2 diabetes mellitus without complication, without long-term current use of insulin  (HCC); Supervision of high risk pregnancy, antepartum; Antepartum multigravida of advanced maternal age; Obesity affecting pregnancy, antepartum; and Type 2 diabetes mellitus complicating pregnancy, antepartum on their problem list.  Patient reports no complaints.  Contractions: Irregular. Vag. Bleeding: None.  Movement: Present. Denies leaking of fluid.   The following portions of the patient's history were reviewed and updated as appropriate: allergies, current medications, past family history, past medical history, past social history, past surgical history and problem list.   Objective:   Vitals:   08/25/24 0911  BP: 110/73  Pulse: 83  Weight: 172 lb (78 kg)    Fetal Status:  Fetal Heart Rate (bpm): 150   Movement: Present    General: Alert, oriented and cooperative. Patient is in no acute distress.  Skin: Skin is warm and dry. No rash noted.   Cardiovascular: Normal heart rate noted  Respiratory: Normal respiratory effort, no problems with respiration noted  Abdomen: Soft, gravid, appropriate for gestational age.  Pain/Pressure: Present     Pelvic: Cervical exam deferred        Extremities: Normal range of motion.     Mental Status: Normal mood and affect. Normal behavior. Normal judgment and thought content.      04/27/2024    4:52 PM 04/15/2024    1:42 PM 02/03/2023    3:08 PM  Depression screen PHQ 2/9  Decreased Interest 0 0 1  Down, Depressed, Hopeless 0 0 2  PHQ - 2 Score 0 0 3  Altered sleeping 0 0 1  Tired, decreased energy 0 0 1  Change in appetite 0 0 0  Feeling bad or failure about yourself  0 0 0  Trouble concentrating 0 0 1  Moving slowly or  fidgety/restless 0 1 0  Suicidal thoughts 0 0 0  PHQ-9 Score 0  1  6      Data saved with a previous flowsheet row definition        04/27/2024    4:52 PM 04/15/2024    1:43 PM 02/03/2023    3:08 PM 04/12/2022    9:21 AM  GAD 7 : Generalized Anxiety Score  Nervous, Anxious, on Edge 0 1 1 0  Control/stop worrying 0 0 0 0  Worry too much - different things 0 0 0 0  Trouble relaxing 0 0 1 0  Restless 0 0 0 0  Easily annoyed or irritable 0 0 1 0  Afraid - awful might happen 0 0 0 0  Total GAD 7 Score 0 1 3 0  Anxiety Difficulty    Not difficult at all    Assessment and Plan:  Pregnancy: [id number] at [redacted]w[redacted]d 1. Type 2 diabetes mellitus without complication, without long-term current use of insulin  (HCC) refill - Insulin  Pen Needle (TRUEPLUS 5-BEVEL PEN NEEDLES) 31G X 8 MM MISC; use as directed  Dispense: 100 each; Refill: 3   Media Information  Document Information  Photos    08/25/2024 09:39  Attached To:  Routine Prenatal on 08/25/24 with Eveline Lynwood MATSU, MD  Source Information  Eveline Lynwood MATSU, MD  [facility]   2. [redacted] weeks gestation of pregnancy (Primary)   3. Supervision of high risk pregnancy, antepartum  -  Cervicovaginal ancillary only( [place]) - POCT urinalysis dipstick - Culture, OB Urine  4. Obesity affecting pregnancy, antepartum, unspecified obesity type   Preterm labor symptoms and general obstetric precautions including but not limited to vaginal bleeding, contractions, leaking of fluid and fetal movement were reviewed in detail with the patient. Please refer to After Visit Summary for other counseling recommendations.   Return in about 2 weeks (around 09/08/2024).  Future Appointments  Date Time Provider Department Center  09/01/2024  3:15 PM [facility] PROVIDER 1 [facility]  09/01/2024  3:30 PM [facility] US3 [facility]  09/08/2024  9:35 AM Constant, [place], MD [facility] None  09/16/2024  8:55 AM Erik Kieth BROCKS, MD [facility] None   09/23/2024  9:35 AM Eveline Lynwood MATSU, MD [facility] None  09/29/2024 10:15 AM [facility] PROVIDER 1 [facility]  09/29/2024 10:30 AM [facility] US4 [facility]    Lynwood Eveline, MD

## 2024-08-26 LAB — CERVICOVAGINAL ANCILLARY ONLY
Bacterial Vaginitis (gardnerella): NEGATIVE
Candida Glabrata: POSITIVE — AB
Candida Vaginitis: NEGATIVE
Chlamydia: NEGATIVE
Comment: NEGATIVE
Comment: NEGATIVE
Comment: NEGATIVE
Comment: NEGATIVE
Comment: NEGATIVE
Comment: NORMAL
Neisseria Gonorrhea: NEGATIVE
Trichomonas: NEGATIVE

## 2024-08-27 ENCOUNTER — Other Ambulatory Visit: Payer: Self-pay

## 2024-08-27 LAB — URINE CULTURE, OB REFLEX

## 2024-08-27 LAB — CULTURE, OB URINE

## 2024-08-30 ENCOUNTER — Other Ambulatory Visit: Payer: Self-pay

## 2024-09-01 ENCOUNTER — Ambulatory Visit (HOSPITAL_BASED_OUTPATIENT_CLINIC_OR_DEPARTMENT_OTHER): Payer: Self-pay

## 2024-09-01 ENCOUNTER — Ambulatory Visit: Payer: Self-pay | Attending: Obstetrics and Gynecology | Admitting: Obstetrics and Gynecology

## 2024-09-01 VITALS — BP 126/62 | HR 86

## 2024-09-01 DIAGNOSIS — Z794 Long term (current) use of insulin: Secondary | ICD-10-CM

## 2024-09-01 DIAGNOSIS — O99210 Obesity complicating pregnancy, unspecified trimester: Secondary | ICD-10-CM

## 2024-09-01 DIAGNOSIS — O99213 Obesity complicating pregnancy, third trimester: Secondary | ICD-10-CM | POA: Insufficient documentation

## 2024-09-01 DIAGNOSIS — Z362 Encounter for other antenatal screening follow-up: Secondary | ICD-10-CM | POA: Insufficient documentation

## 2024-09-01 DIAGNOSIS — Z3A32 32 weeks gestation of pregnancy: Secondary | ICD-10-CM | POA: Insufficient documentation

## 2024-09-01 DIAGNOSIS — O24119 Pre-existing diabetes mellitus, type 2, in pregnancy, unspecified trimester: Secondary | ICD-10-CM

## 2024-09-01 DIAGNOSIS — O09523 Supervision of elderly multigravida, third trimester: Secondary | ICD-10-CM

## 2024-09-01 DIAGNOSIS — O09529 Supervision of elderly multigravida, unspecified trimester: Secondary | ICD-10-CM

## 2024-09-01 DIAGNOSIS — O24113 Pre-existing diabetes mellitus, type 2, in pregnancy, third trimester: Secondary | ICD-10-CM

## 2024-09-01 DIAGNOSIS — E669 Obesity, unspecified: Secondary | ICD-10-CM

## 2024-09-01 DIAGNOSIS — E119 Type 2 diabetes mellitus without complications: Secondary | ICD-10-CM

## 2024-09-01 DIAGNOSIS — O0990 Supervision of high risk pregnancy, unspecified, unspecified trimester: Secondary | ICD-10-CM

## 2024-09-01 NOTE — Progress Notes (Signed)
 Maternal-Fetal Medicine Consultation  Name: Lindsay Hunt  MRN: [id number]  GA: [id number] [redacted]w[redacted]d   -Type 2 diabetes.  Patient takes insulin  and her fasting and postprandial levels are within normal range. - Advanced maternal age (38 years). Ultrasound Normal fetal growth and amniotic fluid.  Abdominal circumference measurement is at the 98th percentile.  Cephalic presentation.  Antenatal testing is reassuring.  BPP 8/8. I reassured the patient of the findings and encouraged her to check her blood glucose regularly.  Patient asked questions about bilateral tubal sterilization.  I explained the procedure and the possible failure rate (1%).  Postpartum tubal ligation can be performed after delivery.  I informed the patient that she should sign her papers at least 1 month before delivery. She will be discussing with her provider. Timing of delivery: If diabetes is well-controlled, delivery can be considered at 38 to [redacted] weeks gestation.  Recommendations -Patient has appointments for weekly antenatal testing at your office. - Fetal growth assessment in 4 weeks at our office.     Consultation including face-to-face (more than 50%) counseling 20 minutes.

## 2024-09-02 ENCOUNTER — Other Ambulatory Visit: Payer: Self-pay

## 2024-09-03 ENCOUNTER — Other Ambulatory Visit: Payer: Self-pay

## 2024-09-08 ENCOUNTER — Ambulatory Visit: Payer: Self-pay | Admitting: Obstetrics and Gynecology

## 2024-09-08 ENCOUNTER — Encounter: Payer: Self-pay | Admitting: Obstetrics and Gynecology

## 2024-09-08 VITALS — BP 114/75 | HR 88 | Wt 173.9 lb

## 2024-09-08 DIAGNOSIS — E119 Type 2 diabetes mellitus without complications: Secondary | ICD-10-CM

## 2024-09-08 DIAGNOSIS — Z3A33 33 weeks gestation of pregnancy: Secondary | ICD-10-CM

## 2024-09-08 DIAGNOSIS — O99210 Obesity complicating pregnancy, unspecified trimester: Secondary | ICD-10-CM

## 2024-09-08 DIAGNOSIS — O09529 Supervision of elderly multigravida, unspecified trimester: Secondary | ICD-10-CM

## 2024-09-08 DIAGNOSIS — O0990 Supervision of high risk pregnancy, unspecified, unspecified trimester: Secondary | ICD-10-CM

## 2024-09-08 NOTE — Progress Notes (Signed)
 PRENATAL VISIT NOTE  Subjective:  Lindsay Hunt is a 38 y.o. [phone] at [redacted]w[redacted]d being seen today for ongoing prenatal care.  She is currently monitored for the following issues for this high-risk pregnancy and has Type 2 diabetes mellitus without complication, without long-term current use of insulin  (HCC); Supervision of high risk pregnancy, antepartum; Antepartum multigravida of advanced maternal age; Obesity affecting pregnancy, antepartum; and Type 2 diabetes mellitus complicating pregnancy, antepartum on their problem list.  Patient reports no complaints.  Contractions: Not present. Vag. Bleeding: None.  Movement: Present. Denies leaking of fluid.   The following portions of the patient's history were reviewed and updated as appropriate: allergies, current medications, past family history, past medical history, past social history, past surgical history and problem list.   Objective:   Vitals:   09/08/24 0929  BP: 114/75  Pulse: 88  Weight: 173 lb 14.4 oz (78.9 kg)    Fetal Status:      Movement: Present    General: Alert, oriented and cooperative. Patient is in no acute distress.  Skin: Skin is warm and dry. No rash noted.   Cardiovascular: Normal heart rate noted  Respiratory: Normal respiratory effort, no problems with respiration noted  Abdomen: Soft, gravid, appropriate for gestational age.  Pain/Pressure: Absent     Pelvic: Cervical exam deferred        Extremities: Normal range of motion.  Edema: None  Mental Status: Normal mood and affect. Normal behavior. Normal judgment and thought content.      04/27/2024    4:52 PM 04/15/2024    1:42 PM 02/03/2023    3:08 PM  Depression screen PHQ 2/9  Decreased Interest 0 0 1  Down, Depressed, Hopeless 0 0 2  PHQ - 2 Score 0 0 3  Altered sleeping 0 0 1  Tired, decreased energy 0 0 1  Change in appetite 0 0 0  Feeling bad or failure about yourself  0 0 0  Trouble concentrating 0 0 1  Moving slowly or fidgety/restless 0  1 0  Suicidal thoughts 0 0 0  PHQ-9 Score 0  1  6      Data saved with a previous flowsheet row definition        04/27/2024    4:52 PM 04/15/2024    1:43 PM 02/03/2023    3:08 PM 04/12/2022    9:21 AM  GAD 7 : Generalized Anxiety Score  Nervous, Anxious, on Edge 0 1 1 0  Control/stop worrying 0 0 0 0  Worry too much - different things 0 0 0 0  Trouble relaxing 0 0 1 0  Restless 0 0 0 0  Easily annoyed or irritable 0 0 1 0  Afraid - awful might happen 0 0 0 0  Total GAD 7 Score 0 1 3 0  Anxiety Difficulty    Not difficult at all    Assessment and Plan:  Pregnancy: [id number] at [redacted]w[redacted]d 1. Supervision of high risk pregnancy, antepartum (Primary) Patient is doing well without complaints Undecided on pediatrician Patient desires permanent sterilization- pricing information provided  2. [redacted] weeks gestation of pregnancy   3. Type 2 diabetes mellitus without complication, without long-term current use of insulin  (HCC) Patient did not bring CBG log and reports highest fasting 100, most values in the 80's and highest pp 130 Continue insulin  at current dosing NST reviewed and reactive with baseline 150, mod variability, + accels, no decels Follow up growth ultrasound on 12/17 Discussed important of  glycemic control in third trimester as to avoid neonatal complications  4. Obesity affecting pregnancy, antepartum, unspecified obesity type   5. Antepartum multigravida of advanced maternal age   Preterm labor symptoms and general obstetric precautions including but not limited to vaginal bleeding, contractions, leaking of fluid and fetal movement were reviewed in detail with the patient. Please refer to After Visit Summary for other counseling recommendations.   Return in about 2 weeks (around 09/22/2024) for in person, ROB, High risk.  Future Appointments  Date Time Provider Department Center  09/16/2024  8:55 AM Erik Kieth BROCKS, MD [facility] None  09/23/2024  9:35 AM Eveline Lynwood MATSU, MD [facility] None  09/29/2024 10:15 AM [facility] PROVIDER 1 [facility]  09/29/2024 10:30 AM [facility] US4 [facility]    Winton Felt, MD

## 2024-09-08 NOTE — Progress Notes (Signed)
 ROB 33.[redacted] wks GA NST today Desires BTL/ price list will be given at check out/ has financial aid already Last MFM US  11/19, Next 12/17

## 2024-09-16 ENCOUNTER — Ambulatory Visit: Payer: Self-pay | Admitting: Obstetrics and Gynecology

## 2024-09-16 VITALS — BP 112/73 | HR 86 | Wt 177.0 lb

## 2024-09-16 DIAGNOSIS — Z6834 Body mass index (BMI) 34.0-34.9, adult: Secondary | ICD-10-CM

## 2024-09-16 DIAGNOSIS — O0990 Supervision of high risk pregnancy, unspecified, unspecified trimester: Secondary | ICD-10-CM

## 2024-09-16 DIAGNOSIS — Z3A34 34 weeks gestation of pregnancy: Secondary | ICD-10-CM

## 2024-09-16 DIAGNOSIS — O09529 Supervision of elderly multigravida, unspecified trimester: Secondary | ICD-10-CM

## 2024-09-16 DIAGNOSIS — O24119 Pre-existing diabetes mellitus, type 2, in pregnancy, unspecified trimester: Secondary | ICD-10-CM

## 2024-09-16 DIAGNOSIS — Z3009 Encounter for other general counseling and advice on contraception: Secondary | ICD-10-CM

## 2024-09-16 NOTE — Progress Notes (Signed)
   PRENATAL VISIT NOTE  Subjective:  Lindsay Hunt is a 38 y.o. [id number] at [redacted]w[redacted]d being seen today for ongoing prenatal care.  She is currently monitored for the following issues for this high-risk pregnancy and has Type 2 diabetes mellitus without complication, without long-term current use of insulin  (HCC); Supervision of high risk pregnancy, antepartum; Antepartum multigravida of advanced maternal age; and Obesity affecting pregnancy, antepartum on their problem list.  Patient reports hemorrhoids.  Contractions: Not present. Vag. Bleeding: None.  Movement: Present. Denies leaking of fluid.   The following portions of the patient's history were reviewed and updated as appropriate: allergies, current medications, past family history, past medical history, past social history, past surgical history and problem list.   Objective:   Vitals:   09/16/24 0902  BP: 112/73  Pulse: 86  Weight: 177 lb (80.3 kg)    Fetal Status:     Movement: Present     General:  Alert, oriented and cooperative. Patient is in no acute distress.  Skin: Skin is warm and dry. No rash noted.   Cardiovascular: Normal heart rate noted  Respiratory: Normal respiratory effort, no problems with respiration noted  Abdomen: Soft, gravid, appropriate for gestational age.  Pain/Pressure: Present      Assessment and Plan:  Pregnancy: [id number] at [redacted]w[redacted]d 1. Supervision of high risk pregnancy, antepartum (Primary) 2. [redacted] weeks gestation of pregnancy Tdap & RSV vaccines discussed - accepts. Tdap given. RSV not in stock, will give next visit GBS/GC/CT next visit - Fetal nonstress test  3. Type 2 diabetes mellitus complicating pregnancy, antepartum BG reviewed. Fasting at goal, pan elevated postprandials Will increase basaglar  19/25, regular insulin  19/6/15 AGA on 11/18, next growth scheduled 12/17 NST reactive & reassuring, continue weekly antenatal testing Timing of delivery to be determined with next growth US   4.  Antepartum multigravida of advanced maternal age LR NIPS, normal anatomy  5. BMI 34.0-34.9,adult  6. Request for sterilization - She desires permanent sterilization. Discussed alternatives including LARC options and vasectomy. She declines these options.  - Discussed surgery of salpingectomy and she is agreeable - Risks of surgery include but are not limited to: bleeding, infection, injury to surrounding organs/tissues (i.e. bowel/bladder/ureters), need for additional procedures, wound complications, hospital re-admission, and conversion to open surgery, VTE. We reviewed risk of contraceptive failure and risk of regret.  - Reviewed restrictions and recovery following surgery  Please refer to After Visit Summary for other counseling recommendations.   Return in about 1 week (around 09/23/2024) for NST.  Future Appointments  Date Time Provider Department Center  09/23/2024  9:35 AM Eveline Lynwood MATSU, MD [facility] None  09/29/2024 10:15 AM [facility] PROVIDER 1 [facility]  09/29/2024 10:30 AM [facility] US4 [facility]   Kieth JAYSON Carolin, MD

## 2024-09-16 NOTE — Patient Instructions (Addendum)
 [email or web address]  Hemorrhoids:  Anusol   Anusol  HC  Preparation H  Tucks

## 2024-09-23 ENCOUNTER — Other Ambulatory Visit (HOSPITAL_COMMUNITY): Payer: Self-pay

## 2024-09-23 ENCOUNTER — Other Ambulatory Visit: Payer: Self-pay

## 2024-09-23 ENCOUNTER — Encounter: Payer: Self-pay | Admitting: Obstetrics & Gynecology

## 2024-09-23 ENCOUNTER — Other Ambulatory Visit (HOSPITAL_BASED_OUTPATIENT_CLINIC_OR_DEPARTMENT_OTHER): Payer: Self-pay

## 2024-09-23 ENCOUNTER — Other Ambulatory Visit (HOSPITAL_COMMUNITY)
Admission: RE | Admit: 2024-09-23 | Discharge: 2024-09-23 | Disposition: A | Discharge status: Home / Self Care | Payer: Self-pay | Source: Ambulatory Visit | Attending: Obstetrics & Gynecology | Admitting: Obstetrics & Gynecology

## 2024-09-23 ENCOUNTER — Ambulatory Visit: Payer: Self-pay | Admitting: Obstetrics & Gynecology

## 2024-09-23 VITALS — BP 114/73 | HR 88 | Wt 174.0 lb

## 2024-09-23 DIAGNOSIS — Z3A35 35 weeks gestation of pregnancy: Secondary | ICD-10-CM

## 2024-09-23 DIAGNOSIS — Z794 Long term (current) use of insulin: Secondary | ICD-10-CM

## 2024-09-23 DIAGNOSIS — O0993 Supervision of high risk pregnancy, unspecified, third trimester: Secondary | ICD-10-CM

## 2024-09-23 DIAGNOSIS — O0990 Supervision of high risk pregnancy, unspecified, unspecified trimester: Secondary | ICD-10-CM | POA: Insufficient documentation

## 2024-09-23 DIAGNOSIS — E119 Type 2 diabetes mellitus without complications: Secondary | ICD-10-CM

## 2024-09-23 MED ORDER — BASAGLAR KWIKPEN 100 UNIT/ML ~~LOC~~ SOPN
PEN_INJECTOR | SUBCUTANEOUS | 11 refills | Status: DC
  Filled 2024-09-23: qty 15, 32d supply, fill #0
  Filled 2024-10-11: qty 15, 32d supply, fill #1

## 2024-09-23 MED ORDER — INSULIN REGULAR HUMAN 100 UNIT/ML IJ SOLN
INTRAMUSCULAR | 11 refills | Status: DC
  Filled 2024-09-23: qty 10, 25d supply, fill #0
  Filled 2024-10-11: qty 10, 25d supply, fill #1

## 2024-09-23 NOTE — Progress Notes (Signed)
 PRENATAL VISIT NOTE  Subjective:  Lindsay Hunt is a 38 y.o. [phone] at [redacted]w[redacted]d being seen today for ongoing prenatal care.  She is currently monitored for the following issues for this high-risk pregnancy and has Type 2 diabetes mellitus without complication, without long-term current use of insulin  (HCC); Supervision of high risk pregnancy, antepartum; Antepartum multigravida of advanced maternal age; and Obesity affecting pregnancy, antepartum on their problem list.  Patient reports no complaints.  Contractions: Not present. Vag. Bleeding: None.  Movement: Present. Denies leaking of fluid.   The following portions of the patient's history were reviewed and updated as appropriate: allergies, current medications, past family history, past medical history, past social history, past surgical history and problem list.   Objective:   Vitals:   09/23/24 0933  BP: 114/73  Pulse: 88  Weight: 174 lb (78.9 kg)    Fetal Status:  Fetal Heart Rate (bpm): NST   Movement: Present Presentation: Vertex  General: Alert, oriented and cooperative. Patient is in no acute distress.  Skin: Skin is warm and dry. No rash noted.   Cardiovascular: Normal heart rate noted  Respiratory: Normal respiratory effort, no problems with respiration noted  Abdomen: Soft, gravid, appropriate for gestational age.  Pain/Pressure: Present     Pelvic: Cervical exam performed in the presence of a chaperone Dilation: 1 Effacement (%): 30 Station: Ballotable  Extremities: Normal range of motion.  Edema: None  Mental Status: Normal mood and affect. Normal behavior. Normal judgment and thought content.      04/27/2024    4:52 PM 04/15/2024    1:42 PM 02/03/2023    3:08 PM  Depression screen PHQ 2/9  Decreased Interest 0 0 1  Down, Depressed, Hopeless 0 0 2  PHQ - 2 Score 0 0 3  Altered sleeping 0 0 1  Tired, decreased energy 0 0 1  Change in appetite 0 0 0  Feeling bad or failure about yourself  0 0 0  Trouble  concentrating 0 0 1  Moving slowly or fidgety/restless 0 1 0  Suicidal thoughts 0 0 0  PHQ-9 Score 0  1  6      Data saved with a previous flowsheet row definition        04/27/2024    4:52 PM 04/15/2024    1:43 PM 02/03/2023    3:08 PM 04/12/2022    9:21 AM  GAD 7 : Generalized Anxiety Score  Nervous, Anxious, on Edge 0 1 1 0  Control/stop worrying 0 0 0 0  Worry too much - different things 0 0 0 0  Trouble relaxing 0 0 1 0  Restless 0 0 0 0  Easily annoyed or irritable 0 0 1 0  Afraid - awful might happen 0 0 0 0  Total GAD 7 Score 0 1 3 0  Anxiety Difficulty    Not difficult at all     Media Information  Document Information  Photographic Image: Photos    09/23/2024 10:03  Attached To:  Routine Prenatal on 09/23/24 with Eveline Lynwood MATSU, MD  Source Information  Eveline Lynwood MATSU, MD  [facility]   Assessment and Plan:  Pregnancy: [phone] at [redacted]w[redacted]d 1. Supervision of high risk pregnancy, antepartum (Primary)  - Fetal nonstress test - Culture, beta strep (group b only) - Cervicovaginal ancillary only( [facility])  2. [redacted] weeks gestation of pregnancy Reactive NST - Fetal nonstress test - Culture, beta strep (group b only) - Cervicovaginal ancillary only( [facility])  3. Type 2 diabetes mellitus without complication, without long-term current use of insulin  (HCC) Few elevated PP - Insulin  Glargine (BASAGLAR  KWIKPEN) 100 UNIT/ML; Inject 22 Units into the skin in the morning AND 25 Units every evening.  Dispense: 15 mL; Refill: 11 - insulin  regular (NOVOLIN R) 100 units/mL injection; Inject 0.19 mLs (19 Units total) into the skin daily with breakfast AND 0.15 mLs (15 Units total) daily with supper AND 0.06 mLs (6 Units total) daily with lunch.  Dispense: 10 mL; Refill: 11  Preterm labor symptoms and general obstetric precautions including but not limited to vaginal bleeding, contractions, leaking of fluid and fetal movement were reviewed in detail with the  patient. Please refer to After Visit Summary for other counseling recommendations.   Return in about 1 week (around 09/30/2024).  Future Appointments  Date Time Provider Department Center  09/29/2024 10:15 AM [facility] PROVIDER 1 [facility]  09/29/2024 10:30 AM [facility] US[facility]  10/18/2024  6:45 AM [facility] SCHED ROOM [facility] None    Lynwood Solomons, MD

## 2024-09-23 NOTE — Progress Notes (Signed)
ROB NST GBS 

## 2024-09-24 ENCOUNTER — Other Ambulatory Visit: Payer: Self-pay

## 2024-09-24 LAB — CERVICOVAGINAL ANCILLARY ONLY
Chlamydia: NEGATIVE
Comment: NEGATIVE
Comment: NORMAL
Neisseria Gonorrhea: NEGATIVE

## 2024-09-26 LAB — CULTURE, BETA STREP (GROUP B ONLY): Strep Gp B Culture: POSITIVE — AB

## 2024-09-29 ENCOUNTER — Other Ambulatory Visit: Payer: Self-pay

## 2024-09-29 ENCOUNTER — Encounter: Payer: Self-pay | Admitting: Pharmacist

## 2024-09-29 ENCOUNTER — Ambulatory Visit: Payer: Self-pay

## 2024-09-29 ENCOUNTER — Ambulatory Visit: Payer: Self-pay | Attending: Obstetrics and Gynecology | Admitting: Obstetrics and Gynecology

## 2024-09-29 VITALS — BP 124/71 | HR 83

## 2024-09-29 DIAGNOSIS — O3663X0 Maternal care for excessive fetal growth, third trimester, not applicable or unspecified: Secondary | ICD-10-CM | POA: Insufficient documentation

## 2024-09-29 DIAGNOSIS — O09529 Supervision of elderly multigravida, unspecified trimester: Secondary | ICD-10-CM

## 2024-09-29 DIAGNOSIS — E119 Type 2 diabetes mellitus without complications: Secondary | ICD-10-CM

## 2024-09-29 DIAGNOSIS — O0990 Supervision of high risk pregnancy, unspecified, unspecified trimester: Secondary | ICD-10-CM

## 2024-09-29 DIAGNOSIS — Z794 Long term (current) use of insulin: Secondary | ICD-10-CM | POA: Insufficient documentation

## 2024-09-29 DIAGNOSIS — O99210 Obesity complicating pregnancy, unspecified trimester: Secondary | ICD-10-CM

## 2024-09-29 DIAGNOSIS — O99213 Obesity complicating pregnancy, third trimester: Secondary | ICD-10-CM

## 2024-09-29 DIAGNOSIS — O09523 Supervision of elderly multigravida, third trimester: Secondary | ICD-10-CM

## 2024-09-29 DIAGNOSIS — Z3A36 36 weeks gestation of pregnancy: Secondary | ICD-10-CM

## 2024-09-29 DIAGNOSIS — O24119 Pre-existing diabetes mellitus, type 2, in pregnancy, unspecified trimester: Secondary | ICD-10-CM

## 2024-09-29 DIAGNOSIS — O24113 Pre-existing diabetes mellitus, type 2, in pregnancy, third trimester: Secondary | ICD-10-CM | POA: Insufficient documentation

## 2024-09-29 DIAGNOSIS — E669 Obesity, unspecified: Secondary | ICD-10-CM

## 2024-09-29 DIAGNOSIS — Z362 Encounter for other antenatal screening follow-up: Secondary | ICD-10-CM | POA: Insufficient documentation

## 2024-09-29 NOTE — Progress Notes (Signed)
 Maternal-Fetal Medicine Consultation  Name: Waylon Koffler  MRN: [id number]  GA: [id number] [redacted]w[redacted]d   Patient is here for fetal growth assessment and antenatal testing. Type 2 diabetes.  Patient reports she takes Lantus  insulin  19 units in the morning and Novolin regular 6/0/15 units with meals. Blood pressure today at our office is 120/71 mmHg. Obstetrical history significant for 3 term vaginal deliveries.  All her children are around 7 pounds at birth.  Ultrasound The estimated fetal weight is at the 98th percentile and the abdominal circumference measurement at the 99th percentile.  Normal amniotic fluid.  Cephalic presentation.  Antenatal testing is reassuring.  BPP 8/8. I counseled the patient with the help of Spanish language interpreter present in the room.  Type 2 diabetes I reviewed her blood glucose log.  Fasting levels are within normal range and some postprandial levels are above 140-150 mg/dL.  I counseled the patient that ultrasound has limitations in accurately estimating fetal weights.  Large for gestational age fetus is likely from suboptimal control of diabetes.  We discussed timing of delivery.  In the presence of fetal macrosomia and suboptimal postprandial levels, delivery should be considered at [redacted] weeks gestation.  Risks of poorly controlled diabetes include stillbirth.  Patient agreed to be delivered at [redacted] weeks gestation.  Patient has a prenatal visit appointment tomorrow.  Patient would like to have bilateral tubal sterilization but could not afford the cost.  Recommendations - Consider delivery at [redacted] weeks gestation. - Weekly NST at your office until delivery.     Consultation including face-to-face (more than 50%) counseling 20 minutes.

## 2024-09-30 ENCOUNTER — Ambulatory Visit: Payer: Self-pay | Admitting: Obstetrics & Gynecology

## 2024-09-30 ENCOUNTER — Other Ambulatory Visit: Payer: Self-pay

## 2024-09-30 VITALS — BP 119/77 | HR 78 | Wt 174.0 lb

## 2024-09-30 DIAGNOSIS — O0990 Supervision of high risk pregnancy, unspecified, unspecified trimester: Secondary | ICD-10-CM

## 2024-09-30 DIAGNOSIS — E119 Type 2 diabetes mellitus without complications: Secondary | ICD-10-CM

## 2024-09-30 DIAGNOSIS — Z3A36 36 weeks gestation of pregnancy: Secondary | ICD-10-CM

## 2024-09-30 DIAGNOSIS — O99820 Streptococcus B carrier state complicating pregnancy: Secondary | ICD-10-CM

## 2024-09-30 NOTE — Progress Notes (Signed)
 Pt was seen by MFM yesterday for BPP - advised delivery at 38 weeks.  Pt has IOL scheduled 10/18/24.

## 2024-09-30 NOTE — Progress Notes (Signed)
 PRENATAL VISIT NOTE  Subjective:  Lindsay Hunt is a 38 y.o. [phone] at [redacted]w[redacted]d being seen today for ongoing prenatal care.  She is currently monitored for the following issues for this high-risk pregnancy and has Type 2 diabetes mellitus without complication, without long-term current use of insulin  (HCC); Supervision of high risk pregnancy, antepartum; Antepartum multigravida of advanced maternal age; and Obesity affecting pregnancy, antepartum on their problem list.  Patient reports no complaints.  Contractions: Irregular. Vag. Bleeding: None.  Movement: Present. Denies leaking of fluid.   The following portions of the patient's history were reviewed and updated as appropriate: allergies, current medications, past family history, past medical history, past social history, past surgical history and problem list.   Objective:   Vitals:   09/30/24 1013  BP: 119/77  Pulse: 78  Weight: 174 lb (78.9 kg)    Fetal Status:  Fetal Heart Rate (bpm): 145   Movement: Present    General: Alert, oriented and cooperative. Patient is in no acute distress.  Skin: Skin is warm and dry. No rash noted.   Cardiovascular: Normal heart rate noted  Respiratory: Normal respiratory effort, no problems with respiration noted  Abdomen: Soft, gravid, appropriate for gestational age.  Pain/Pressure: Absent     Pelvic: Cervical exam deferred        Extremities: Normal range of motion.     Mental Status: Normal mood and affect. Normal behavior. Normal judgment and thought content.      04/27/2024    4:52 PM 04/15/2024    1:42 PM 02/03/2023    3:08 PM  Depression screen PHQ 2/9  Decreased Interest 0 0 1  Down, Depressed, Hopeless 0 0 2  PHQ - 2 Score 0 0 3  Altered sleeping 0 0 1  Tired, decreased energy 0 0 1  Change in appetite 0 0 0  Feeling bad or failure about yourself  0 0 0  Trouble concentrating 0 0 1  Moving slowly or fidgety/restless 0 1 0  Suicidal thoughts 0 0 0  PHQ-9 Score 0  1  6       Data saved with a previous flowsheet row definition      Media Information  Document Information  Photographic Image: Photos    09/30/2024 11:03  Attached To:  Routine Prenatal on 09/30/24 with Eveline Lynwood MATSU, MD  Source Information  Eveline Lynwood MATSU, MD  [facility]      04/27/2024    4:52 PM 04/15/2024    1:43 PM 02/03/2023    3:08 PM 04/12/2022    9:21 AM  GAD 7 : Generalized Anxiety Score  Nervous, Anxious, on Edge 0 1 1 0  Control/stop worrying 0 0 0 0  Worry too much - different things 0 0 0 0  Trouble relaxing 0 0 1 0  Restless 0 0 0 0  Easily annoyed or irritable 0 0 1 0  Afraid - awful might happen 0 0 0 0  Total GAD 7 Score 0 1 3 0  Anxiety Difficulty    Not difficult at all    Assessment and Plan:  Pregnancy: [id number] at [redacted]w[redacted]d 1. Supervision of high risk pregnancy, antepartum (Primary)   2. Type 2 diabetes mellitus without complication, without long-term current use of insulin  (HCC) 80 % in range  3. [redacted] weeks gestation of pregnancy Suspected LGA 38 week IOL 4. GBS (group B Streptococcus carrier), +RV culture, currently pregnant Tx in labor  Preterm labor symptoms and general  obstetric precautions including but not limited to vaginal bleeding, contractions, leaking of fluid and fetal movement were reviewed in detail with the patient. Please refer to After Visit Summary for other counseling recommendations.   Return in about 1 week (around 10/07/2024).  Future Appointments  Date Time Provider Department Center  10/15/2024  9:35 AM Delores Nidia CROME, FNP [facility] None  10/18/2024  6:45 AM [facility] SCHED ROOM [facility] None    Lynwood Solomons, MD

## 2024-10-11 ENCOUNTER — Other Ambulatory Visit: Payer: Self-pay

## 2024-10-13 ENCOUNTER — Telehealth (HOSPITAL_COMMUNITY): Payer: Self-pay | Admitting: *Deleted

## 2024-10-13 NOTE — Telephone Encounter (Signed)
 Preadmission screen Interpreter number [phone]

## 2024-10-14 NOTE — L&D Delivery Note (Signed)
 OB/[facility]N Faculty Practice Delivery Note  Lindsay Hunt is a 39 y.o. H4E6986 s/p SVD at [redacted]w[redacted]d. She was admitted for IOL.   ROM: 0h 41m with thin meconium fluid GBS Status: Positive/-- (12/11 1121)  Maximum Maternal Temperature: 98.6  Labor Progress: Initial SVE: 3/50/B. AROM. She then progressed to complete.   Delivery Date/Time: 10/18/2024 2:49 PM Delivery: Called to room and patient was complete and pushing. Head delivered straight occiput anterior. nuchal cord present x1 loose. Shoulder dystocia noted, McRoberts position initiated and suprapubic pressure applied from maternal right. The anterior shoulder (infant left) could not be delivered. The posterior axilla was grasped and rotated with successful delivery of the posterior shoulder. Infant placed on mother's abdomen, dried and stimulated. Cord clamped x 2 after 30 second delay, and cut. Cord blood collected . Placenta delivered spontaneously with gentle cord traction. Fundus firm with massage and Pitocin . Labia, perineum, vagina, and cervix were inspected and repaired with local lidocaine .  Placenta:  spontaneous Intact Placenta to L&D Complications:shoulder dystocia Lacerations: 2nd degree and hemostatic b/l periurethral abrasions QBL: Analgesia: Labor support without medications and IV pain meds  Newborn Data:  Living status:Living Gender:Female Apgars:7 ,9  Weight:4050 g           Barabara Maier, DO [facility] [employer]

## 2024-10-15 ENCOUNTER — Ambulatory Visit: Payer: Self-pay | Admitting: Obstetrics and Gynecology

## 2024-10-15 VITALS — Wt 177.2 lb

## 2024-10-15 DIAGNOSIS — O09523 Supervision of elderly multigravida, third trimester: Secondary | ICD-10-CM

## 2024-10-15 DIAGNOSIS — N898 Other specified noninflammatory disorders of vagina: Secondary | ICD-10-CM

## 2024-10-15 DIAGNOSIS — O0993 Supervision of high risk pregnancy, unspecified, third trimester: Secondary | ICD-10-CM

## 2024-10-15 DIAGNOSIS — O368130 Decreased fetal movements, third trimester, not applicable or unspecified: Secondary | ICD-10-CM

## 2024-10-15 DIAGNOSIS — E119 Type 2 diabetes mellitus without complications: Secondary | ICD-10-CM

## 2024-10-15 DIAGNOSIS — O3663X0 Maternal care for excessive fetal growth, third trimester, not applicable or unspecified: Secondary | ICD-10-CM

## 2024-10-15 DIAGNOSIS — O0990 Supervision of high risk pregnancy, unspecified, unspecified trimester: Secondary | ICD-10-CM

## 2024-10-15 DIAGNOSIS — Z3A38 38 weeks gestation of pregnancy: Secondary | ICD-10-CM

## 2024-10-15 DIAGNOSIS — Z794 Long term (current) use of insulin: Secondary | ICD-10-CM

## 2024-10-15 DIAGNOSIS — O09529 Supervision of elderly multigravida, unspecified trimester: Secondary | ICD-10-CM

## 2024-10-15 DIAGNOSIS — O99820 Streptococcus B carrier state complicating pregnancy: Secondary | ICD-10-CM

## 2024-10-15 NOTE — Progress Notes (Signed)
" ° °  PRENATAL VISIT NOTE  Subjective:  Lindsay Hunt is a 39 y.o. H4E6986 at [redacted]w[redacted]d being seen today for ongoing prenatal care.  She is currently monitored for the following issues for this high-risk pregnancy and has Type 2 diabetes mellitus without complication, without long-term current use of insulin  (HCC); Supervision of high risk pregnancy, antepartum; Antepartum multigravida of advanced maternal age; and Obesity affecting pregnancy, antepartum on their problem list.  Patient reports reports feeling change in movement over the past 2 weeks. Reports still feeling movement but a change.  Contractions: Irritability. Vag. Bleeding: None.  Movement: (!) Decreased. Denies leaking of fluid.   The following portions of the patient's history were reviewed and updated as appropriate: allergies, current medications, past family history, past medical history, past social history, past surgical history and problem list.   Objective:   Vitals:   10/15/24 0933  Weight: 177 lb 3.2 oz (80.4 kg)    Fetal Status:  Fetal Heart Rate (bpm): 138   Movement: (!) Decreased    General: Alert, oriented and cooperative. Patient is in no acute distress.  Skin: Skin is warm and dry. No rash noted.   Cardiovascular: Normal heart rate noted  Respiratory: Normal respiratory effort, no problems with respiration noted  Abdomen: Soft, gravid, appropriate for gestational age.  Pain/Pressure: Absent     Pelvic: Cervical exam deferred        Extremities: Normal range of motion.  Edema: None  Mental Status: Normal mood and affect. Normal behavior. Normal judgment and thought content.   Assessment and Plan:  Pregnancy: [id number] at [redacted]w[redacted]d 1. Supervision of high risk pregnancy, antepartum (Primary) BP and FHR normal   2. Antepartum multigravida of advanced maternal age   62. Type 2 diabetes mellitus without complication, without long-term current use of insulin  (HCC)1.  4. Excessive fetal growth affecting  management of pregnancy in third trimester, single or unspecified fetus Has IOL 1/5- orders placed today Basaglar  and novolin BPP 8/8 EFW 98% 12/17 NST reactive today and feeling movement while on monitor, discussed fetal movement and strict precautions discussed regarding movement and when to go to MAU She has anxieties about IOL, discussed methods of induction, pain management, process  4. GBS (group B Streptococcus carrier), +RV culture, currently pregnant Tx in labor  Term labor symptoms and general obstetric precautions including but not limited to vaginal bleeding, contractions, leaking of fluid and fetal movement were reviewed in detail with the patient. Please refer to After Visit Summary for other counseling recommendations.   Return postpartum   Future Appointments  Date Time Provider Department Center  10/18/2024  6:45 AM [facility] SCHED ROOM [facility] None    Nidia Daring, FNP  "

## 2024-10-15 NOTE — Progress Notes (Signed)
 Pt presents for ROB visit. Pt c/o decreased fetal movement for 2 weeks. Pt also reports yellow, sticky vaginal discharge.

## 2024-10-18 ENCOUNTER — Inpatient Hospital Stay (HOSPITAL_COMMUNITY)
Admission: RE | Admit: 2024-10-18 | Discharge: 2024-10-20 | DRG: 807 | Disposition: A | Discharge status: Home / Self Care | Payer: MEDICAID | Attending: Family Medicine | Admitting: Family Medicine

## 2024-10-18 ENCOUNTER — Inpatient Hospital Stay (HOSPITAL_COMMUNITY): Payer: MEDICAID

## 2024-10-18 ENCOUNTER — Encounter (HOSPITAL_COMMUNITY): Payer: Self-pay | Admitting: Obstetrics & Gynecology

## 2024-10-18 DIAGNOSIS — O24113 Pre-existing diabetes mellitus, type 2, in pregnancy, third trimester: Secondary | ICD-10-CM | POA: Diagnosis present

## 2024-10-18 DIAGNOSIS — Z603 Acculturation difficulty: Secondary | ICD-10-CM | POA: Diagnosis present

## 2024-10-18 DIAGNOSIS — O3663X0 Maternal care for excessive fetal growth, third trimester, not applicable or unspecified: Secondary | ICD-10-CM | POA: Diagnosis present

## 2024-10-18 DIAGNOSIS — Z3A39 39 weeks gestation of pregnancy: Secondary | ICD-10-CM | POA: Diagnosis not present

## 2024-10-18 DIAGNOSIS — Z833 Family history of diabetes mellitus: Secondary | ICD-10-CM | POA: Diagnosis not present

## 2024-10-18 DIAGNOSIS — Z7982 Long term (current) use of aspirin: Secondary | ICD-10-CM | POA: Diagnosis not present

## 2024-10-18 DIAGNOSIS — O09529 Supervision of elderly multigravida, unspecified trimester: Secondary | ICD-10-CM

## 2024-10-18 DIAGNOSIS — O99214 Obesity complicating childbirth: Secondary | ICD-10-CM | POA: Diagnosis not present

## 2024-10-18 DIAGNOSIS — Z794 Long term (current) use of insulin: Secondary | ICD-10-CM | POA: Diagnosis not present

## 2024-10-18 DIAGNOSIS — O99824 Streptococcus B carrier state complicating childbirth: Secondary | ICD-10-CM | POA: Diagnosis present

## 2024-10-18 DIAGNOSIS — O09523 Supervision of elderly multigravida, third trimester: Secondary | ICD-10-CM | POA: Diagnosis not present

## 2024-10-18 DIAGNOSIS — O0990 Supervision of high risk pregnancy, unspecified, unspecified trimester: Secondary | ICD-10-CM

## 2024-10-18 DIAGNOSIS — O2412 Pre-existing diabetes mellitus, type 2, in childbirth: Secondary | ICD-10-CM | POA: Diagnosis present

## 2024-10-18 DIAGNOSIS — O99820 Streptococcus B carrier state complicating pregnancy: Secondary | ICD-10-CM | POA: Diagnosis not present

## 2024-10-18 LAB — COMPREHENSIVE METABOLIC PANEL WITH GFR
ALT: 16 U/L (ref 0–44)
AST: 20 U/L (ref 15–41)
Albumin: 3.2 g/dL — ABNORMAL LOW (ref 3.5–5.0)
Alkaline Phosphatase: 196 U/L — ABNORMAL HIGH (ref 38–126)
Anion gap: 11 (ref 5–15)
BUN: 9 mg/dL (ref 6–20)
CO2: 17 mmol/L — ABNORMAL LOW (ref 22–32)
Calcium: 9.5 mg/dL (ref 8.9–10.3)
Chloride: 107 mmol/L (ref 98–111)
Creatinine, Ser: 0.45 mg/dL (ref 0.44–1.00)
GFR, Estimated: >60 mL/min
Glucose, Bld: 99 mg/dL (ref 70–99)
Potassium: 3.5 mmol/L (ref 3.5–5.1)
Sodium: 135 mmol/L (ref 135–145)
Total Bilirubin: 0.2 mg/dL (ref 0.0–1.2)
Total Protein: 6.5 g/dL (ref 6.5–8.1)

## 2024-10-18 LAB — CBC
HCT: 34.9 % — ABNORMAL LOW (ref 36.0–46.0)
Hemoglobin: 12.1 g/dL (ref 12.0–15.0)
MCH: 30.6 pg (ref 26.0–34.0)
MCHC: 34.7 g/dL (ref 30.0–36.0)
MCV: 88.4 fL (ref 80.0–100.0)
Platelets: 178 K/uL (ref 150–400)
RBC: 3.95 MIL/uL (ref 3.87–5.11)
RDW: 13.3 % (ref 11.5–15.5)
WBC: 6.4 K/uL (ref 4.0–10.5)
nRBC: 0 % (ref 0.0–0.2)

## 2024-10-18 LAB — SYPHILIS: RPR W/REFLEX TO RPR TITER AND TREPONEMAL ANTIBODIES, TRADITIONAL SCREENING AND DIAGNOSIS ALGORITHM: RPR Ser Ql: NONREACTIVE

## 2024-10-18 LAB — GLUCOSE, CAPILLARY
Glucose-Capillary: 104 mg/dL — ABNORMAL HIGH (ref 70–99)
Glucose-Capillary: 78 mg/dL (ref 70–99)
Glucose-Capillary: 75 mg/dL (ref 70–99)
Glucose-Capillary: 107 mg/dL — ABNORMAL HIGH (ref 70–99)
Glucose-Capillary: 146 mg/dL — ABNORMAL HIGH (ref 70–99)

## 2024-10-18 LAB — TYPE AND SCREEN
ABO/RH(D): A POS
Antibody Screen: NEGATIVE

## 2024-10-18 MED ORDER — INSULIN GLARGINE-YFGN 100 UNIT/ML ~~LOC~~ SOLN
22.0000 [IU] | Freq: Two times a day (BID) | SUBCUTANEOUS | Status: DC
  Administered 2024-10-18: 22 [IU] via SUBCUTANEOUS
  Filled 2024-10-18 (×4): qty 0.22

## 2024-10-18 MED ORDER — IBUPROFEN 600 MG PO TABS
600.0000 mg | ORAL_TABLET | Freq: Four times a day (QID) | ORAL | Status: DC
  Administered 2024-10-18 – 2024-10-20 (×7): 600 mg via ORAL
  Filled 2024-10-18 (×8): qty 1

## 2024-10-18 MED ORDER — ACETAMINOPHEN 325 MG PO TABS: 650.0000 mg | ORAL_TABLET | ORAL | Status: DC | PRN

## 2024-10-18 MED ORDER — FENTANYL CITRATE (PF) 100 MCG/2ML IJ SOLN
100.0000 ug | INTRAMUSCULAR | Status: DC | PRN
  Administered 2024-10-18 (×2): 100 ug via INTRAVENOUS
  Filled 2024-10-18 (×2): qty 2

## 2024-10-18 MED ORDER — DIBUCAINE (PERIANAL) 1 % EX OINT: 1.0000 | TOPICAL_OINTMENT | CUTANEOUS | Status: DC | PRN

## 2024-10-18 MED ORDER — DIPHENHYDRAMINE HCL 25 MG PO CAPS: 25.0000 mg | ORAL_CAPSULE | Freq: Four times a day (QID) | ORAL | Status: DC | PRN

## 2024-10-18 MED ORDER — WITCH HAZEL-GLYCERIN EX PADS: 1.0000 | MEDICATED_PAD | CUTANEOUS | Status: DC | PRN

## 2024-10-18 MED ORDER — OXYTOCIN-SODIUM CHLORIDE 30-0.9 UT/500ML-% IV SOLN
1.0000 m[IU]/min | INTRAVENOUS | Status: DC
  Administered 2024-10-18: 2 m[IU]/min via INTRAVENOUS
  Filled 2024-10-18: qty 500

## 2024-10-18 MED ORDER — ONDANSETRON HCL 4 MG PO TABS: 4.0000 mg | ORAL_TABLET | ORAL | Status: DC | PRN

## 2024-10-18 MED ORDER — BENZOCAINE-MENTHOL 20-0.5 % EX AERO
1.0000 | INHALATION_SPRAY | CUTANEOUS | Status: DC | PRN
  Administered 2024-10-18: 1 via TOPICAL
  Filled 2024-10-18: qty 56

## 2024-10-18 MED ORDER — TETANUS-DIPHTH-ACELL PERTUSSIS 5-2-15.5 LF-MCG/0.5 IM SUSP: 0.5000 mL | Freq: Once | INTRAMUSCULAR | Status: DC

## 2024-10-18 MED ORDER — LIDOCAINE HCL (PF) 1 % IJ SOLN
30.0000 mL | INTRAMUSCULAR | Status: AC | PRN
  Administered 2024-10-18: 30 mL via SUBCUTANEOUS
  Filled 2024-10-18: qty 30

## 2024-10-18 MED ORDER — LACTATED RINGERS IV SOLN: 500.0000 mL | INTRAVENOUS | Status: DC | PRN

## 2024-10-18 MED ORDER — SOD CITRATE-CITRIC ACID 500-334 MG/5ML PO SOLN: 30.0000 mL | ORAL | Status: DC | PRN

## 2024-10-18 MED ORDER — SODIUM CHLORIDE 0.9 % IV SOLN
5.0000 10*6.[IU] | Freq: Once | INTRAVENOUS | Status: AC
  Administered 2024-10-18: 5 10*6.[IU] via INTRAVENOUS
  Filled 2024-10-18: qty 5

## 2024-10-18 MED ORDER — PENICILLIN G POT IN DEXTROSE 60000 UNIT/ML IV SOLN
3.0000 10*6.[IU] | INTRAVENOUS | Status: DC
  Administered 2024-10-18 (×2): 3 10*6.[IU] via INTRAVENOUS
  Filled 2024-10-18 (×2): qty 50

## 2024-10-18 MED ORDER — TERBUTALINE SULFATE 1 MG/ML IJ SOLN: 0.2500 mg | Freq: Once | INTRAMUSCULAR | Status: DC | PRN

## 2024-10-18 MED ORDER — LACTATED RINGERS IV SOLN: INTRAVENOUS | Status: DC

## 2024-10-18 MED ORDER — OXYTOCIN-SODIUM CHLORIDE 30-0.9 UT/500ML-% IV SOLN: 2.5000 [IU]/h | INTRAVENOUS | Status: DC

## 2024-10-18 MED ORDER — SIMETHICONE 80 MG PO CHEW: 80.0000 mg | CHEWABLE_TABLET | ORAL | Status: DC | PRN

## 2024-10-18 MED ORDER — INSULIN ASPART 100 UNIT/ML IJ SOLN
12.0000 [IU] | Freq: Three times a day (TID) | INTRAMUSCULAR | Status: DC
  Administered 2024-10-18: 12 [IU] via SUBCUTANEOUS
  Filled 2024-10-18: qty 12

## 2024-10-18 MED ORDER — ONDANSETRON HCL 4 MG/2ML IJ SOLN: 4.0000 mg | INTRAMUSCULAR | Status: DC | PRN

## 2024-10-18 MED ORDER — ONDANSETRON HCL 4 MG/2ML IJ SOLN: 4.0000 mg | Freq: Four times a day (QID) | INTRAMUSCULAR | Status: DC | PRN

## 2024-10-18 MED ORDER — OXYTOCIN BOLUS FROM INFUSION
333.0000 mL | Freq: Once | INTRAVENOUS | Status: AC
  Administered 2024-10-18: 333 mL via INTRAVENOUS

## 2024-10-18 MED ORDER — SENNOSIDES-DOCUSATE SODIUM 8.6-50 MG PO TABS
2.0000 | ORAL_TABLET | Freq: Every day | ORAL | Status: DC
  Administered 2024-10-19: 2 via ORAL
  Filled 2024-10-18: qty 2

## 2024-10-18 MED ORDER — COCONUT OIL OIL
1.0000 | TOPICAL_OIL | Status: DC | PRN
  Administered 2024-10-18: 1 via TOPICAL

## 2024-10-18 MED ORDER — PRENATAL MULTIVITAMIN CH
1.0000 | ORAL_TABLET | Freq: Every day | ORAL | Status: DC
  Administered 2024-10-19: 1 via ORAL
  Filled 2024-10-18: qty 1

## 2024-10-18 NOTE — H&P (Addendum)
 OBSTETRIC ADMISSION HISTORY AND PHYSICAL  Lindsay Hunt is 39 y.o. [phone] with IUP at [redacted]w[redacted]d 10/25/2024, by Last Menstrual Period presenting for IOL 2/2 T2DM, LGA. She received her prenatal care at [facility]   ROS (+) FM, ctx irregular (-) VB, LOF. HA, visual changes, CP, SOB, RUQ pain, peripheral edema.   Prenatal History/Complications NURSING  PROVIDER  Office Location [facility] Dating by LMP=6wk US   PNC Model Traditional Anatomy U/S Normal  Initiated care at  12wks                 Language  Spanish               LAB RESULTS   Support Person  FOB/ HUSBAND Genetics NIPS: low risk female AFP: neg      NT/IT (FT only)        Carrier Screen Horizon: neg  Rhogam  A/Positive/-- (07/03 1425) A1C/GTT Early HgbA1C: 7.4 Third trimester 2 hr GTT: n/a  Flu Vaccine No/ DECLINED 09/16/24      TDaP Vaccine  09/16/24 Blood Type A/Positive/-- (07/03 1425)  RSV Vaccine  DECLINED 09/23/24 Antibody Negative (07/03 1425)  COVID Vaccine Yes Rubella <0.90 (07/03 1425)  Feeding Plan breast RPR Non Reactive (10/22 1552)  Contraception nexplanon (self pay) HBsAg Negative (07/03 1425)  Circumcision No HIV Non Reactive (10/22 1552)  Pediatrician   [facility] HCVAb Non Reactive (07/03 1425)  Prenatal Classes        BTL Consent  (SELF PAY) Pap       Diagnosis  Date Value Ref Range Status  03/17/2023     Final    - Negative for intraepithelial lesion or malignancy (NILM)    BTL Pre-payment   GC/CT Initial:   36wks:    VBAC Consent   GBS   For PCN allergy, check sensitivities   BRx Optimized? [ ]  yes   [X]  no      DME Rx [X]  BP cuff [ ]  Weight Scale Waterbirth  [ ]  Class [ ]  Consent [ ]  CNM visit  PHQ9 & GAD7 [X]  new OB [  ] 28 weeks  [  ] 36 weeks Induction  [ ]  Orders Entered [ ] Foley   OB History  Gravida Para Term Preterm AB Living  5 3 3  0 1 3  SAB IAB Ectopic Multiple Live Births  1 0 0 0 3    # Outcome Date GA Lbr Len/2nd Weight Sex Type Anes PTL Lv  5 Current           4  SAB 2019 [redacted]w[redacted]d         3 Term 10/03/06 [redacted]w[redacted]d  3175 g F Vag-Spont   LIV  2 Term 10/11/02 [redacted]w[redacted]d  3175 g M Vag-Spont   LIV  1 Term 07/03/99 [redacted]w[redacted]d  3175 g F Vag-Spont   LIV   Patient Active Problem List   Diagnosis Date Noted   Type 2 diabetes mellitus affecting pregnancy in third trimester, antepartum 10/18/2024   Obesity affecting pregnancy, antepartum 06/25/2024   Antepartum multigravida of advanced maternal age 32/15/2025   Supervision of high risk pregnancy, antepartum 04/15/2024   Type 2 diabetes mellitus without complication, without long-term current use of insulin  (HCC) 12/03/2018    Past Medical History: Past Medical History:  Diagnosis Date   Diabetes mellitus without complication (HCC)    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   Hair loss 12/03/2018   Hyperlipidemia    Hypertension     Past Surgical History:  Past Surgical History:  Procedure Laterality Date   NO PAST SURGERIES      Social History Social History   Socioeconomic History   Marital status: Married    Spouse name: Not on file   Number of children: Not on file   Years of education: Not on file   Highest education level: Not on file  Occupational History   Not on file  Tobacco Use   Smoking status: Never   Smokeless tobacco: Never  Vaping Use   Vaping status: Never Used  Substance and Sexual Activity   Alcohol use: No   Drug use: Never   Sexual activity: Yes    Birth control/protection: None  Other Topics Concern   Not on file  Social History Narrative   Not on file   Social Drivers of Health   Tobacco Use: Low Risk (10/18/2024)   Patient History    Smoking Tobacco Use: Never    Smokeless Tobacco Use: Never    Passive Exposure: Not on file  Financial Resource Strain: Not on file  Food Insecurity: No Food Insecurity (10/18/2024)   Epic    Worried About Programme Researcher, Broadcasting/film/video in the Last Year: Never true    Ran Out of Food in the Last Year: Never true  Transportation Needs: No Transportation  Needs (10/18/2024)   Epic    Lack of Transportation (Medical): No    Lack of Transportation (Non-Medical): No  Physical Activity: Not on file  Stress: Not on file  Social Connections: Moderately Integrated (10/18/2024)   Social Connection and Isolation Panel    Frequency of Communication with Friends and Family: More than three times a week    Frequency of Social Gatherings with Friends and Family: More than three times a week    Attends Religious Services: More than 4 times per year    Active Member of [employer] or Organizations: No    Attends Banker Meetings: Never    Marital Status: Married  Depression (PHQ2-9): Low Risk (04/27/2024)   Depression (PHQ2-9)    PHQ-2 Score: 0  Alcohol Screen: Not on file  Housing: Low Risk (10/18/2024)   Epic    Unable to Pay for Housing in the Last Year: No    Number of Times Moved in the Last Year: 0    Homeless in the Last Year: No  Utilities: Not At Risk (10/18/2024)   Epic    Threatened with loss of utilities: No  Health Literacy: Not on file    Family History: Family History  Problem Relation Age of Onset   Diabetes Mother    Cancer Mother    Diabetes Father    Diabetes Sister    Diabetes Brother    Diabetes Maternal Grandmother    Cancer Maternal Grandmother    Diabetes Maternal Grandfather    Diabetes Paternal Grandmother    Diabetes Paternal Grandfather     Allergies: Allergies[1]  Medications Prior to Admission  Medication Sig Dispense Refill Last Dose/Taking   aspirin  EC 81 MG tablet Take 1 tablet (81 mg total) by mouth daily. Swallow whole. 30 tablet 5 Past Week   Insulin  Glargine (BASAGLAR  KWIKPEN) 100 UNIT/ML Inject 22 Units into the skin in the morning AND 25 Units every evening. 15 mL 11 10/18/2024   insulin  regular (NOVOLIN R) 100 units/mL injection Inject 0.19 mLs (19 Units total) into the skin daily with breakfast AND 0.15 mLs (15 Units total) daily with supper AND 0.06 mLs (6 Units total) daily  with lunch. 10 mL  11 10/17/2024   Prenatal Vit-Fe Fumarate-FA (MULTIVITAMIN-PRENATAL) 27-0.8 MG TABS tablet Take 1 tablet by mouth daily at 12 noon.   10/17/2024   hydrocortisone  (ANUSOL -HC) 2.5 % rectal cream Place rectally 2 (two) times daily. (Patient not taking: Reported on 10/15/2024) 30 g 2    Insulin  Pen Needle (TRUEPLUS 5-BEVEL PEN NEEDLES) 31G X 8 MM MISC use as directed 100 each 3    Insulin  Syringe-Needle U-100 (INSULIN  SYRINGE 1CC/31GX5/16) 31G X 5/16 1 ML MISC USE AS DIRECTED WITH HUMULIN  R VIAL *INJECTS TWICE A DAY* 100 each 4    terconazole  (TERAZOL 7 ) 0.4 % vaginal cream Place 1 applicator vaginally at bedtime. (Patient not taking: Reported on 10/15/2024) 45 g 0      Review of Systems  All systems reviewed and negative except as stated in HPI  PHYSICAL EXAM Blood pressure 129/71, pulse 72, temperature 97.9 F (36.6 C), temperature source Oral, resp. rate 20, height 5' 2 (1.575 m), weight 80.3 kg, last menstrual period 01/19/2024, SpO2 100%. General appearance: alert, cooperative, appears stated age, and no distress Lungs: respirations nonlabored Heart: regular rate Abdomen: gravid  Fetal monitoringBaseline: 125 bpm, Variability: Good {> 6 bpm), Accelerations: Reactive, and Decelerations: Absent Uterine activityFrequency: Every 2-6 minutes, Duration: 60-90 seconds, and Intensity: moderate  Dilation: 3 Effacement (%): 50 Station: Ballotable Exam by:: Bria Torrence Presentation: cephalic   Prenatal labs: ABO, Rh: --/--/PENDING (01/05 0356) Antibody: PENDING (01/05 0356) Rubella: <0.90 (07/03 1425) RPR: Non Reactive (10/22 1552)  HBsAg: Negative (07/03 1425)  HIV: Non Reactive (10/22 1552)   Lab Results  Component Value Date   GBS Positive (A) 09/23/2024    Anatomy US : WNL  Immunization History  Administered Date(s) Administered   Influenza,inj,Quad PF,6+ Mos 09/12/2016, 06/02/2018, 07/28/2019   PFIZER(Purple Top)SARS-COV-2 Vaccination 06/12/2020, 07/03/2020   Pneumococcal  Conjugate-13 03/13/2017   Pneumococcal Polysaccharide-23 06/02/2018   Tdap 03/12/2017, 09/16/2024    Prenatal Transfer Tool  Maternal Diabetes: Yes:  Diabetes Type:  Pre-pregnancy, Insulin /Medication controlled Genetic Screening: Normal Maternal Ultrasounds/Referrals: Normal Fetal Ultrasounds or other Referrals:  Referred to Materal Fetal Medicine  Maternal Substance Abuse:  No Significant Maternal Medications:  Meds include: Other:  Insulin  Significant Maternal Lab Results: Group B Strep positive Number of Prenatal Visits:greater than 3 verified prenatal visits Maternal Vaccinations:TDap Other Comments:  None   Results for orders placed or performed during the hospital encounter of 10/18/24 (from the past 24 hours)  Glucose, capillary   Collection Time: 10/18/24  3:51 AM  Result Value Ref Range   Glucose-Capillary 104 (H) 70 - 99 mg/dL  CBC   Collection Time: 10/18/24  3:55 AM  Result Value Ref Range   WBC 6.4 4.0 - 10.5 K/uL   RBC 3.95 3.87 - 5.11 MIL/uL   Hemoglobin 12.1 12.0 - 15.0 g/dL   HCT 65.0 (L) 63.9 - 53.9 %   MCV 88.4 80.0 - 100.0 fL   MCH 30.6 26.0 - 34.0 pg   MCHC 34.7 30.0 - 36.0 g/dL   RDW 86.6 88.4 - 84.4 %   Platelets 178 150 - 400 K/uL   nRBC 0.0 0.0 - 0.2 %  Comprehensive metabolic panel   Collection Time: 10/18/24  3:55 AM  Result Value Ref Range   Sodium 135 135 - 145 mmol/L   Potassium 3.5 3.5 - 5.1 mmol/L   Chloride 107 98 - 111 mmol/L   CO2 17 (L) 22 - 32 mmol/L   Glucose, Bld 99 70 - 99 mg/dL  BUN 9 6 - 20 mg/dL   Creatinine, Ser 9.54 0.44 - 1.00 mg/dL   Calcium  9.5 8.9 - 10.3 mg/dL   Total Protein 6.5 6.5 - 8.1 g/dL   Albumin 3.2 (L) 3.5 - 5.0 g/dL   AST 20 15 - 41 U/L   ALT 16 0 - 44 U/L   Alkaline Phosphatase 196 (H) 38 - 126 U/L   Total Bilirubin 0.2 0.0 - 1.2 mg/dL   GFR, Estimated >39 >39 mL/min   Anion gap 11 5 - 15  Type and screen   Collection Time: 10/18/24  3:56 AM  Result Value Ref Range   ABO/RH(D) PENDING    Antibody  Screen PENDING    Sample Expiration      10/21/2024,2359 Performed at [facility], [address] [place], [place] [address]     Patient Active Problem List   Diagnosis Date Noted   Type 2 diabetes mellitus affecting pregnancy in third trimester, antepartum 10/18/2024   Obesity affecting pregnancy, antepartum 06/25/2024   Antepartum multigravida of advanced maternal age 53/15/2025   Supervision of high risk pregnancy, antepartum 04/15/2024   Type 2 diabetes mellitus without complication, without long-term current use of insulin  (HCC) 12/03/2018    ASSESSMENT & PLAN Lindsay Hunt is 39 y.o. [id number] with IUP at [redacted]w[redacted]d 10/25/2024, by Last Menstrual Period admitted for IOL for T2DM.  Sono at [redacted]w[redacted]d: normal anatomy, cephalic presentation, anterior placenta, EFW 3607g, (98%)  #Labor: IOL in latent phase, high station suspect pitocin  will help fetal head to descend for good progress with consideration of AROM when head well applied and GBS treatment adequate.  #Pain: Per patient preference, encourage ambulation #FWB: Cat 1  #GBS status:  Positive, treat with PCN #Feeding: Breastmilk  #Reproductive Life planning: Nexplanon at [facility] (self-pay)  #Circ:  no - Remote Spanish interpreter used for entire episode of care.    Camie Rote, MSN, CNM, RNC-OB Certified Nurse Midwife, [facility] 10/18/2024 5:01 AM     [1] No Known Allergies

## 2024-10-18 NOTE — Progress Notes (Signed)
 LABOR PROGRESS NOTE Pt rechecked at 1440 Patient comfortable without epidural. Pit at none. SCE: 9/100/+1 AROM light mec  FHT: baseline 120, mod variability, +accels, +decels; overall category II Toco: q2 min  A/P:  Imminent delivery  Lindsay Maier, DO 5:32 PM

## 2024-10-18 NOTE — Discharge Summary (Signed)
 "    Postpartum Discharge Summary  Date of Service updated***     Patient Name: Lindsay Hunt DOB: July 03, 1986 MRN: [id number]  Date of admission: 10/18/2024 Delivery date:10/18/2024 Delivering provider: DANNY GERALDS Date of discharge: 10/19/2024  Admitting diagnosis: Type 2 diabetes mellitus affecting pregnancy in third trimester, antepartum [O24.113] Intrauterine pregnancy: [redacted]w[redacted]d     Secondary diagnosis:  Principal Problem:   Type 2 diabetes mellitus affecting pregnancy in third trimester, antepartum Active Problems:   Antepartum multigravida of advanced maternal age   Shoulder dystocia, delivered  Additional problems: ***    Discharge diagnosis: Term Pregnancy Delivered and Type 2 DM                                              Post partum procedures:none Augmentation: AROM and Pitocin  Complications: None  Hospital course: Induction of Labor With Vaginal Delivery   39 y.o. yo [id number] at [redacted]w[redacted]d was admitted to the hospital 10/18/2024 for induction of labor.  Indication for induction: TYPE 2 DM and AMA.  Patient had an labor course complicated by shoulder dystocia Membrane Rupture Time/Date: 2:40 PM,10/18/2024  Delivery Method:Vaginal, Spontaneous Operative Delivery:N/A Episiotomy: None Lacerations:  2nd degree;Perineal Details of delivery can be found in separate delivery note.  Patient had a postpartum course that was uncomplicated.. Patient is discharged home 10/19/2024.  Newborn Data: Birth date:10/18/2024 Birth time:2:49 PM Gender:Female Living status:Living Apgars:7 ,9  Weight:4050 g  Magnesium Sulfate received: No BMZ received: No Rhophylac:N/A MMR:No T-DaP:Given prenatally Flu: No RSV Vaccine received: No Transfusion:No  Immunizations received: Immunization History  Administered Date(s) Administered   Influenza,inj,Quad PF,6+ Mos 09/12/2016, 06/02/2018, 07/28/2019   PFIZER(Purple Top)SARS-COV-2 Vaccination 06/12/2020, 07/03/2020   Pneumococcal Conjugate-13  03/13/2017   Pneumococcal Polysaccharide-23 06/02/2018   Tdap 03/12/2017, 09/16/2024    Physical exam  Vitals:   10/18/24 1745 10/18/24 2234 10/19/24 0314 10/19/24 0645  BP: 110/78 121/74 115/69 123/70  Pulse: 81 87 74 81  Resp: 17 16 16 16   Temp: 98.6 F (37 C) 98.7 F (37.1 C) 98.4 F (36.9 C) 98.9 F (37.2 C)  TempSrc: Oral Oral Oral Oral  SpO2: 99% 99% 99% 99%  Weight:      Height:       General: alert, cooperative, and no distress Lochia: appropriate Uterine Fundus: firm Incision: N/A DVT Evaluation: No evidence of DVT seen on physical exam. Labs: Lab Results  Component Value Date   WBC 7.9 10/19/2024   HGB 11.0 (L) 10/19/2024   HCT 32.4 (L) 10/19/2024   MCV 89.0 10/19/2024   PLT 166 10/19/2024      Latest Ref Rng & Units 10/18/2024    3:55 AM  CMP  Glucose 70 - 99 mg/dL 99   BUN 6 - 20 mg/dL 9   Creatinine 9.55 - 8.99 mg/dL 9.54   Sodium 864 - 854 mmol/L 135   Potassium 3.5 - 5.1 mmol/L 3.5   Chloride 98 - 111 mmol/L 107   CO2 22 - 32 mmol/L 17   Calcium  8.9 - 10.3 mg/dL 9.5   Total Protein 6.5 - 8.1 g/dL 6.5   Total Bilirubin 0.0 - 1.2 mg/dL 0.2   Alkaline Phos 38 - 126 U/L 196   AST 15 - 41 U/L 20   ALT 0 - 44 U/L 16    Edinburgh Score:    10/18/2024   10:34 PM  Van  Postnatal Depression Scale Screening Tool  I have been able to laugh and see the funny side of things. --   No data recorded  After visit meds:  Allergies as of 10/19/2024   No Known Allergies      Medication List     STOP taking these medications    Basaglar  KwikPen 100 UNIT/ML   HumuLIN  R 100 UNIT/ML injection Generic drug: insulin  regular   hydrocortisone  2.5 % rectal cream Commonly known as: ANUSOL -HC   terconazole  0.4 % vaginal cream Commonly known as: TERAZOL 7    TRUEplus 5-Bevel Pen Needles 31G X 8 MM Misc Generic drug: Insulin  Pen Needle   TRUEplus Insulin  Syringe 31G X 5/16 1 ML Misc Generic drug: Insulin  Syringe-Needle U-100       TAKE these  medications    acetaminophen  325 MG tablet Commonly known as: Tylenol  Take 2 tablets (650 mg total) by mouth every 4 (four) hours as needed (for pain scale < 4).   ibuprofen  600 MG tablet Commonly known as: ADVIL  Take 1 tablet (600 mg total) by mouth every 6 (six) hours.   metFORMIN  1000 MG tablet Commonly known as: GLUCOPHAGE  Take 1 tablet (1,000 mg total) by mouth daily with breakfast. Start taking on: October 20, 2024   multivitamin-prenatal 27-0.8 MG Tabs tablet Take 1 tablet by mouth daily at 12 noon.         Discharge home in stable condition Infant Feeding: Breast Infant Disposition:home with mother Discharge instruction: per After Visit Summary and Postpartum booklet. Activity: Advance as tolerated. Pelvic rest for 6 weeks.  Diet: routine diet Future Appointments: Future Appointments  Date Time Provider Department Center  11/29/2024  2:10 PM Wallace Joesph LABOR, PA [facility] None   Follow up Visit:  Follow-up Information     [facility] for [facility] [place]. Go in 5 week(s).   Specialty: Obstetrics and Gynecology Why: Please follow up in 4-6wk for a postpartum visit Contact information: [address] [place]  [address] [phone]                 Please schedule this patient for a In person postpartum visit in 6 weeks with the following provider: Any provider. Additional Postpartum F/U:none  High risk pregnancy complicated by: T2DM Delivery mode:  Vaginal, Spontaneous Anticipated Birth Control:  Nexplanon, outpatient, health department   10/19/2024 Jennifer M Ozan, DO    "

## 2024-10-18 NOTE — Plan of Care (Signed)

## 2024-10-18 NOTE — Lactation Note (Signed)
 This note was copied from a baby's chart. Lactation Consultation Note  Patient Name: Lindsay Hunt Date: 10/18/2024 Age:41 hours Reason for consult: Initial assessment;Term;Maternal endocrine disorder In person interpreter Hadassah used to communicate with MOB in Spanish.  P4- MOB's feeding plan is to offer breast and formula. Per MOB, she had only given formula so far because she has no milk. LC reviewed colostrum vs mature milk. LC encouraged MOB to always offer her breast first, then offer formula. MOB's visitor reports that infant had just eaten 10 mL of formula 20 minutes prior to consult. LC encouraged MOB to offer more if infant is wanting up, but try not to go past 20 mL at this time. LC encouraged MOB to call for latching assistance. LC also encouraged MOB to pump if she is not going to latch to ensure proper breast stimulation. MOB verbalized understanding. MOB denies having further questions or concerns at this time.  LC reviewed the first 24 hr birthday nap, day 2 cluster feeding, feeding infant on cue 8-12x in 24 hrs, not allowing infant to go over 3 hrs without a feeding, CDC milk storage guidelines, LC services handout and engorgement/breast care. LC encouraged MOB to call for further assistance as needed.  Maternal Data Has patient been taught Hand Expression?: No Does the patient have breastfeeding experience prior to this delivery?: Yes How long did the patient breastfeed?: 1 year with all three older children, reports no supply or latching issues  Feeding Mother's Current Feeding Choice: Breast Milk and Formula Nipple Type: Slow - flow  Lactation Tools Discussed/Used Pump Education: Milk Storage  Interventions Interventions: Breast feeding basics reviewed;Education;LC Services brochure  Discharge Discharge Education: Engorgement and breast care;Warning signs for feeding baby Pump: Manual;Personal WIC Program: No  Consult Status Consult Status:  Follow-up Date: 10/19/24 Follow-up type: In-patient    Recardo Hoit BS, IBCLC 10/18/2024, 5:54 PM

## 2024-10-19 ENCOUNTER — Other Ambulatory Visit: Payer: Self-pay

## 2024-10-19 ENCOUNTER — Other Ambulatory Visit (HOSPITAL_COMMUNITY): Payer: Self-pay

## 2024-10-19 LAB — CBC
HCT: 32.4 % — ABNORMAL LOW (ref 36.0–46.0)
Hemoglobin: 11 g/dL — ABNORMAL LOW (ref 12.0–15.0)
MCH: 30.2 pg (ref 26.0–34.0)
MCHC: 34 g/dL (ref 30.0–36.0)
MCV: 89 fL (ref 80.0–100.0)
Platelets: 166 K/uL (ref 150–400)
RBC: 3.64 MIL/uL — ABNORMAL LOW (ref 3.87–5.11)
RDW: 13.3 % (ref 11.5–15.5)
WBC: 7.9 K/uL (ref 4.0–10.5)
nRBC: 0 % (ref 0.0–0.2)

## 2024-10-19 LAB — GLUCOSE, CAPILLARY
Glucose-Capillary: 114 mg/dL — ABNORMAL HIGH (ref 70–99)
Glucose-Capillary: 59 mg/dL — ABNORMAL LOW (ref 70–99)
Glucose-Capillary: 119 mg/dL — ABNORMAL HIGH (ref 70–99)
Glucose-Capillary: 155 mg/dL — ABNORMAL HIGH (ref 70–99)

## 2024-10-19 MED ORDER — METFORMIN HCL 500 MG PO TABS
1000.0000 mg | ORAL_TABLET | Freq: Every day | ORAL | Status: DC
  Administered 2024-10-20: 1000 mg via ORAL
  Filled 2024-10-19: qty 2

## 2024-10-19 MED ORDER — IBUPROFEN 600 MG PO TABS
600.0000 mg | ORAL_TABLET | Freq: Four times a day (QID) | ORAL | 0 refills | Status: DC
  Filled 2024-10-19: qty 30, 8d supply, fill #0

## 2024-10-19 MED ORDER — INFLUENZA VIRUS VACC SPLIT PF (FLUZONE) 0.5 ML IM SUSY: 0.5000 mL | PREFILLED_SYRINGE | INTRAMUSCULAR | Status: DC

## 2024-10-19 MED ORDER — ACETAMINOPHEN 325 MG PO TABS: 650.0000 mg | ORAL_TABLET | ORAL | Status: DC | PRN

## 2024-10-19 MED ORDER — METFORMIN HCL 1000 MG PO TABS
1000.0000 mg | ORAL_TABLET | Freq: Every day | ORAL | 4 refills | Status: DC
  Filled 2024-10-19: qty 90, 90d supply, fill #0

## 2024-10-19 NOTE — Progress Notes (Addendum)
 Lisa leftwich-kirby, CNM,  aware of patients bedtime blood sugar of 155. No new orders at this time.

## 2024-10-19 NOTE — Progress Notes (Signed)
 Post Partum Day 1  Spanish interpreter used  Subjective: Doing well. No acute events overnight. Pain is controlled and bleeding is appropriate. She is eating, drinking, voiding, and ambulating without issue. She is breast feeding which is going well. She has no other concerns at this time.  Objective: Blood pressure 123/70, pulse 81, temperature 98.9 F (37.2 C), temperature source Oral, resp. rate 16, height 5' 2 (1.575 m), weight 80.3 kg, last menstrual period 01/19/2024, SpO2 99%, unknown if currently breastfeeding.  Physical Exam:  General: alert, cooperative, and no distress Lochia: appropriate Uterine Fundus: firm Incision: NA DVT Evaluation: No evidence of DVT seen on physical exam.  Recent Labs    10/18/24 0355 10/19/24 0437  HGB 12.1 11.0*  HCT 34.9* 32.4*    Assessment/Plan: Vallerie Kiri Hinderliter is a 39 y.o. [id number] on PPD# 1 s/p NSVD.  Progressing well. Meeting postpartum milestones. VSS. Continue routine postpartum care.  -Type 2 DM- on insulin  during pregnancy, will transition back to metformin  with plans for outpatient follow up  Feeding: breastfeeding Contraception: outpatient Nexplanon Circumcision: declined  Dispo: Meeting milestones appropriately, possible discharge home pending baby's status   LOS: 1 day   Aika Brzoska M Andrez Lieurance, DO  10/19/2024, 9:16 AM

## 2024-10-20 ENCOUNTER — Other Ambulatory Visit: Payer: Self-pay

## 2024-10-20 ENCOUNTER — Other Ambulatory Visit (HOSPITAL_COMMUNITY): Payer: Self-pay

## 2024-10-20 LAB — GLUCOSE, CAPILLARY: Glucose-Capillary: 162 mg/dL — ABNORMAL HIGH (ref 70–99)

## 2024-10-20 MED ORDER — METFORMIN HCL 1000 MG PO TABS: 1000.0000 mg | ORAL_TABLET | Freq: Two times a day (BID) | ORAL | Status: AC

## 2024-10-20 NOTE — Progress Notes (Addendum)
 Pt declined use of interpreter for discharge education. RN went through papers in spanish with parents and MOB had no questions. TOC meds also given to patient with instructions how to pay

## 2024-10-20 NOTE — Lactation Note (Signed)
 This note was copied from a baby's chart. Lactation Consultation Note  Patient Name: Boy Sumaiya Arruda Unijb'd Date: 10/20/2024 Age:41 hours Reason for consult: Follow-up assessment;Maternal endocrine disorder  Spanish interpreter Tita used via video.  P4, Mother is doubtful of her milk supply so she states she has been formula feeding. Reviewed supply and demand.  Encouraged offering the breast before formula. Mother was concerned that the type of formula the baby is taking is causing lots of gas.  Passed information of to her RN Vernell. Suggest breastfeeding may cause less gas as it is easily digestible. Reviewed engorgement care and monitoring voids/stools.  Maternal Data Does the patient have breastfeeding experience prior to this delivery?: Yes  Feeding Mother's Current Feeding Choice: Breast Milk and Formula Nipple Type: Slow - flow   Interventions Interventions: Education  Discharge Discharge Education: Engorgement and breast care;Warning signs for feeding baby Pump: Manual;Personal  Consult Status Consult Status: Complete Date: 10/20/24  Shannon Levorn Lemme  RN, IBCLC 10/20/2024, 8:53 AM

## 2024-10-20 NOTE — Progress Notes (Signed)
 NT notified this RN that AM glucose level was after pt ate breakfast

## 2024-10-20 NOTE — Patient Instructions (Signed)
 If you are interested in an outpatient lactation consultation -- available in-office or virtually -- please reach out to us  at:  [facility] (First Floor) ?? [address], [place], [place]  ?? [phone] Please leave a message on our lactation voicemail box. We welcome any lactation-related questions or concerns -- our team is here to support you and your baby.  Lactation Support Groups Join us  at: [facility] ?? Tuesdays, 10:00 AM - 12:00 PM ?? [address], [employer], [employer]  Lactating parents and lap babies are welcome!  ?? [email or web address]  ?? [email or web address] -------------  Si est interesado en una consulta ambulatoria de lactancia, disponible en el consultorio o virtualmente, comunquese con nosotros en:  [facility] ?? [address], [place], [place]  ?? [phone] Por favor, deje un mensaje en nuestro buzn de voz de lactancia. Estamos aqu para responder cualquier pregunta o inquietud relacionada con la lactancia y para apoyarle a usted y a su beb.  Grupos de Apoyo para la Lactancia nase a nosotros en: [facility] ?? Martes, de 10:00 a. m. a 12:00 p. m. ?? [address], [place], [place]  Se admiten madres lactantes y bebs en regazo.  ?? [email or web address]  ?? [email or web address]      Geraldina Louder, [facility] for [facility]

## 2024-10-21 ENCOUNTER — Other Ambulatory Visit: Payer: Self-pay

## 2024-10-26 ENCOUNTER — Telehealth (HOSPITAL_COMMUNITY): Payer: Self-pay

## 2024-10-26 NOTE — Telephone Encounter (Signed)
 10/26/2024 1309  Name: Lindsay Hunt MRN: [id number] DOB: 1986-07-31  Reason for Call:  Transition of Care Hospital Discharge Call  Contact Status: Patient Contact Status: Message  Language assistant needed: Interpreter Mode: Telephonic Interpreter Interpreter Name: [phone] Interpreter Phone Number - If applicable: [phone]        Follow-Up Questions:    Van Postnatal Depression Scale:  In the Past 7 Days:    PHQ2-9 Depression Scale:     Discharge Follow-up:    Post-discharge interventions: NA  Signature  Rosaline Deretha PEAK

## 2024-11-15 ENCOUNTER — Other Ambulatory Visit: Payer: Self-pay

## 2024-11-15 ENCOUNTER — Encounter (HOSPITAL_COMMUNITY): Payer: Self-pay | Admitting: *Deleted

## 2024-11-15 ENCOUNTER — Ambulatory Visit (HOSPITAL_COMMUNITY)
Admission: EM | Admit: 2024-11-15 | Discharge: 2024-11-15 | Disposition: A | Discharge status: Home / Self Care | Payer: Self-pay | Source: Home / Self Care | Attending: Family Medicine | Admitting: Family Medicine

## 2024-11-15 DIAGNOSIS — L989 Disorder of the skin and subcutaneous tissue, unspecified: Secondary | ICD-10-CM

## 2024-11-15 MED ORDER — BACITRACIN ZINC 500 UNIT/GM EX OINT
TOPICAL_OINTMENT | CUTANEOUS | Status: AC
  Filled 2024-11-15: qty 0.9

## 2024-11-15 MED ORDER — AMOXICILLIN-POT CLAVULANATE 875-125 MG PO TABS
1.0000 | ORAL_TABLET | Freq: Two times a day (BID) | ORAL | 0 refills | Status: AC
  Filled 2024-11-15: qty 14, 7d supply, fill #0

## 2024-11-15 MED ORDER — MUPIROCIN 2 % EX OINT
1.0000 | TOPICAL_OINTMENT | Freq: Two times a day (BID) | CUTANEOUS | 0 refills | Status: AC
  Filled 2024-11-15: qty 22, 11d supply, fill #0

## 2024-11-15 NOTE — Discharge Instructions (Signed)
 Take amoxicillin -clavulanate 875 mg--1 tab twice daily with food for 7 days  Put mupirocin  ointment on the sore areas twice daily until improved  Please make a follow-up appointment with your primary care.

## 2024-11-16 ENCOUNTER — Other Ambulatory Visit: Payer: Self-pay

## 2024-11-29 ENCOUNTER — Ambulatory Visit: Payer: Self-pay | Admitting: Family Medicine
